# Patient Record
Sex: Female | Born: 1985 | Race: Black or African American | Hispanic: No | Marital: Married | Smoking: Never smoker
Health system: Southern US, Community
[De-identification: ages and names within clinical notes are randomized; demographics above are authoritative.]

## PROBLEM LIST (undated history)

## (undated) DIAGNOSIS — I10 Essential (primary) hypertension: Secondary | ICD-10-CM

## (undated) DIAGNOSIS — E119 Type 2 diabetes mellitus without complications: Secondary | ICD-10-CM

## (undated) DIAGNOSIS — Z9289 Personal history of other medical treatment: Secondary | ICD-10-CM

## (undated) DIAGNOSIS — N189 Chronic kidney disease, unspecified: Secondary | ICD-10-CM

## (undated) DIAGNOSIS — I509 Heart failure, unspecified: Secondary | ICD-10-CM

## (undated) DIAGNOSIS — D649 Anemia, unspecified: Secondary | ICD-10-CM

## (undated) DIAGNOSIS — R011 Cardiac murmur, unspecified: Secondary | ICD-10-CM

---
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
## 2006-03-13 ENCOUNTER — Emergency Department (HOSPITAL_COMMUNITY): Admission: EM | Admit: 2006-03-13 | Discharge: 2006-03-13 | Payer: Self-pay | Admitting: Emergency Medicine

## 2006-04-13 DIAGNOSIS — J309 Allergic rhinitis, unspecified: Secondary | ICD-10-CM | POA: Insufficient documentation

## 2006-04-13 DIAGNOSIS — E669 Obesity, unspecified: Secondary | ICD-10-CM | POA: Insufficient documentation

## 2007-05-04 LAB — URINE MICROSCOPIC
Bacteria: NEGATIVE /HPF
Casts: 0 /LPF
Crystals, urine: 0 /LPF
Mucus: 0 /LPF

## 2007-05-04 LAB — HCG URINE, QL. - POC: Pregnancy test,urine (POC): NEGATIVE

## 2007-05-04 MED ORDER — NAPROXEN 500 MG TAB
500 mg | ORAL_TABLET | Freq: Two times a day (BID) | ORAL | Status: DC
Start: 2007-05-04 — End: 2007-09-12

## 2007-05-04 MED ORDER — TRAMADOL 50 MG TAB
50 mg | ORAL | Status: AC
Start: 2007-05-04 — End: 2007-05-04
  Administered 2007-05-04: 12:00:00 via ORAL

## 2007-05-04 MED ORDER — CYCLOBENZAPRINE 10 MG TAB
10 mg | ORAL_TABLET | Freq: Two times a day (BID) | ORAL | Status: AC
Start: 2007-05-04 — End: 2007-05-14

## 2007-05-04 MED ORDER — IBUPROFEN 800 MG TAB
800 mg | ORAL | Status: DC
Start: 2007-05-04 — End: 2007-05-04

## 2007-05-04 MED FILL — TRAMADOL 50 MG TAB: 50 mg | ORAL | Qty: 1

## 2007-05-04 MED FILL — IBUPROFEN 800 MG TAB: 800 mg | ORAL | Qty: 1

## 2007-05-04 NOTE — ED Provider Notes (Signed)
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  Presents w/ 2-day h/o right side pain with movement and deep breaths.  No obvious trauma.  No fever, cough, n/v/d, GU complaints.  PCP = none.    Flank Pain   The history is provided by the patient. Associated symptoms include abdominal pain. Pertinent negatives include no chest pain, no fever, no headaches and no dysuria.        No past medical history on file.     No past surgical history on file.      No family history on file.     History   Social History   ??? Marital Status: N/A     Spouse Name: N/A     Number of Children: N/A   ??? Years of Education: N/A   Occupational History   ??? Not on file.   Social History Main Topics   ??? Tobacco Use: Never   ??? Alcohol Use: No   ??? Drug Use: No   ??? Sexually Active: No   Other Topics Concern   ??? Not on file   Social History Narrative   ??? No narrative on file           ALLERGIES: Review of patient's allergies indicates no known allergies.      Review of Systems   Constitutional: Negative for fever, chills and malaise/fatigue.   Respiratory: Negative for cough and shortness of breath.    Cardiovascular: Negative for chest pain and palpitations.   Gastrointestinal: Positive for abdominal pain. Negative for nausea, vomiting and diarrhea.   Genitourinary: Positive for flank pain. Negative for dysuria, urgency, frequency and hematuria.   Neurological: Negative for headaches.   All other systems reviewed and are negative.      Filed Vitals:    05/04/2007  7:33 AM   BP: 141/63   Pulse: 106   Temp: 97.4 ??F (36.3 ??C)   Resp: 18   Height: 5' (1.524 m)   SpO2: 97%              Physical Exam   Nursing note and vitals reviewed.  Constitutional: She appears well-developed.        No reproducible POP, only w/ leftward truncal mvmt.   HENT:   Head: Normocephalic and atraumatic.   Neck: Normal range of motion.   Cardiovascular: Normal rate, regular rhythm and normal heart sounds.     Pulmonary/Chest: Effort normal and breath sounds normal. She exhibits no tenderness.   Abdominal: Soft. No tenderness. She has no guarding and no CVA tenderness.   Musculoskeletal: She exhibits no edema.   Neurological: She is alert.   Skin: Skin is warm and dry.   Psychiatric: She has a normal mood and affect.        MDM Coding   Interpretation: labs

## 2007-09-12 MED ORDER — AZITHROMYCIN 1 GRAM ORAL PACKET
1 gram | ORAL | Status: AC
Start: 2007-09-12 — End: 2007-09-12
  Administered 2007-09-12: 22:00:00 via ORAL

## 2007-09-12 MED ORDER — LIDOCAINE HCL 1 % (10 MG/ML) IJ SOLN
10 mg/mL (1 %) | INTRAMUSCULAR | Status: AC
Start: 2007-09-12 — End: 2007-09-12
  Administered 2007-09-12: 22:00:00 via INTRAMUSCULAR

## 2007-09-12 MED FILL — CEFTRIAXONE 250 MG SOLUTION FOR INJECTION: 250 mg | INTRAMUSCULAR | Qty: 250

## 2007-09-12 MED FILL — AZITHROMYCIN 1 GRAM ORAL PACKET: 1 gram | ORAL | Qty: 1

## 2007-09-12 NOTE — ED Notes (Signed)
I have reviewed the chart and agree with the midlevel provider's course of treatment.  Jiayi Lengacher A Corbett III, MD

## 2007-09-12 NOTE — ED Notes (Signed)
States boyfriend dx with chlymadia

## 2007-09-12 NOTE — ED Provider Notes (Signed)
HPI Comments: The patient presents to the ED today for concerns of STD. Boyfriend told her 4 weeks ago that he was diagnosed with an STD. Patient did not get evaluated or treated. Reports occasional vaginal discharge but no pelvic pain/lower back pain. No fever or chills reported.    Vaginal Discharge   The history is provided by the patient.        No past medical history on file.     No past surgical history on file.      No family history on file.     History   Social History   ??? Marital Status: Single     Spouse Name: N/A     Number of Children: N/A   ??? Years of Education: N/A   Occupational History   ??? Not on file.   Social History Main Topics   ??? Tobacco Use: Never   ??? Alcohol Use: No   ??? Drug Use: No   ??? Sexually Active: Yes -- Female partner(s)     Birth Control/ Protection: None   Other Topics Concern   ??? Not on file   Social History Narrative   ??? No narrative on file           ALLERGIES: Review of patient's allergies indicates no known allergies.      Review of Systems   Genitourinary: Positive for vaginal discharge.   All other systems reviewed and are negative.      Filed Vitals:    09/12/2007  5:19 PM   BP: 165/91   Pulse: 87   Temp: 98.2 ??F (36.8 ??C)   Resp: 18   Height: 5\' 5"  (1.651 m)   Weight: 270 lb (122.471 kg)   SpO2: 99%              Physical Exam   Constitutional: She is oriented. She appears well-developed and well-nourished.   HENT:   Head: Normocephalic and atraumatic.   Right Ear: External ear normal.   Left Ear: External ear normal.   Nose: Nose normal.   Mouth/Throat: Oropharynx is clear and moist.   Pulmonary/Chest: Effort normal.   Neurological: She is alert and oriented.   Skin: Skin is warm and dry.   Psychiatric: She has a normal mood and affect. Her behavior is normal.            MDM Coding   Reviewed: nursing note and vitals      The patient has had positive exposure to an STD. Will treated accordingly for exposure. Currently using condoms with boyfriend.

## 2007-09-12 NOTE — ED Notes (Signed)
Pt discharged ambulatory to home. Pt teaching rt discharge and followup instructions. Pt voiced understanding.

## 2009-08-13 ENCOUNTER — Emergency Department (HOSPITAL_COMMUNITY): Admission: EM | Admit: 2009-08-13 | Discharge: 2009-08-13 | Payer: Self-pay | Admitting: Family Medicine

## 2010-05-02 LAB — POCT RAPID STREP A (OFFICE): Streptococcus, Group A Screen (Direct): NEGATIVE

## 2011-01-15 MED ORDER — TRAMADOL 50 MG TAB
50 mg | ORAL_TABLET | Freq: Four times a day (QID) | ORAL | Status: AC | PRN
Start: 2011-01-15 — End: ?

## 2011-01-15 MED ORDER — DIPHTH,PERTUS(AC)TETANUS VAC(PF) 2.5 LF UNIT-8 MCG-5 LF/0.5 ML INJ
INTRAMUSCULAR | Status: AC
Start: 2011-01-15 — End: 2011-01-15
  Administered 2011-01-15: 23:00:00 via INTRAMUSCULAR

## 2011-01-15 NOTE — ED Notes (Signed)
Dc with rx and dc instructions; reviewed with pt; sts understanding; left with so.

## 2011-01-15 NOTE — ED Provider Notes (Signed)
HPI Comments: 25 yo female c/o laceration between her right 1st and 2nd digit after cutting it on a knife while trying to get a piece of cheesecake. Patient denies numbness, tingling or limited ROM. Bleeding controlled. Patient unsure of tetanus status.       Pmhx:   Shx:     Patient is a 25 y.o. female presenting with skin laceration. The history is provided by the patient.   Laceration   The incident occurred 1 to 2 hours ago. The laceration is located on the left hand. The laceration is 3 cm in size. The injury mechanism is a clean knife. Foreign body present: no. The pain is moderate. The pain has been intermittent since onset. Pertinent negatives include no numbness, no tingling, no weakness, no loss of motion, no coolness and no discoloration. It is unknown when the patient last had a tetanus shot.        No past medical history on file.     No past surgical history on file.      No family history on file.     History     Social History   ??? Marital Status: Single     Spouse Name: N/A     Number of Children: N/A   ??? Years of Education: N/A     Occupational History   ??? Not on file.     Social History Main Topics   ??? Smoking status: Never Smoker    ??? Smokeless tobacco: Not on file   ??? Alcohol Use: No   ??? Drug Use: No   ??? Sexually Active: Yes -- Female partner(s)     Birth Control/ Protection: None     Other Topics Concern   ??? Not on file     Social History Narrative   ??? No narrative on file                  ALLERGIES: Review of patient's allergies indicates no known allergies.      Review of Systems   Constitutional: Negative for fever, chills, diaphoresis, activity change, appetite change, fatigue and unexpected weight change.   Respiratory: Negative for apnea, cough, choking, chest tightness, shortness of breath, wheezing and stridor.    Cardiovascular: Negative for chest pain, palpitations and leg swelling.   Gastrointestinal: Negative for nausea, vomiting, diarrhea and constipation.    Musculoskeletal: Negative for myalgias, back pain, joint swelling, arthralgias and gait problem.   Skin: Positive for wound (laceration to right hand). Negative for color change, pallor and rash.   Neurological: Negative for tingling, weakness and numbness.   Psychiatric/Behavioral: Negative for behavioral problems, confusion and agitation.       Filed Vitals:    01/15/11 1434 01/15/11 1818   BP: 165/92 148/89   Pulse: 98 92   Temp: 98.3 ??F (36.8 ??C)    Resp: 17 18   Height: 5\' 5"  (1.651 m)    Weight: 145.151 kg (320 lb)    SpO2: 98%             Physical Exam   Nursing note and vitals reviewed.  Constitutional: She is oriented to person, place, and time. Vital signs are normal. She appears well-developed and well-nourished.  Non-toxic appearance. She does not have a sickly appearance. She does not appear ill. No distress.   HENT:   Head: Normocephalic and atraumatic.   Right Ear: External ear normal.   Left Ear: External ear normal.   Eyes: Conjunctivae and EOM  are normal. Pupils are equal, round, and reactive to light. Right eye exhibits no discharge. Left eye exhibits no discharge. No scleral icterus.   Neck: Normal range of motion. Neck supple.   Cardiovascular: Normal rate, regular rhythm, S1 normal, S2 normal and normal heart sounds.  Exam reveals no gallop.    No murmur heard.  Pulmonary/Chest: Effort normal and breath sounds normal. No respiratory distress. She has no decreased breath sounds. She has no wheezes. She has no rhonchi. She has no rales.   Musculoskeletal:        Right hand: She exhibits swelling. She exhibits normal range of motion, no tenderness, no bony tenderness, normal two-point discrimination, normal capillary refill, no deformity and no laceration. normal sensation noted. Normal strength noted.        Hands:  Neurological: She is alert and oriented to person, place, and time.    Skin: Skin is warm and dry. Laceration noted. No abrasion, no bruising, no burn, no ecchymosis, no lesion and no rash noted. She is not diaphoretic. No erythema. No pallor.   Psychiatric: She has a normal mood and affect. Her speech is normal and behavior is normal. Judgment and thought content normal.        MDM     Risk of Significant Complications, Morbidity, and/or Mortality:   Presenting problems:  Low  Diagnostic procedures:  Low  Management options:  Low      Wound Repair  Date/Time: 01/15/2011 7:51 PM  Performed by: Esmeralda Links provider: Zachery Dauer  Pre-procedure re-eval: Immediately prior to the procedure, the patient was reevaluated and found suitable for the planned procedure and any planned medications.  Location details: right hand  Wound length:2.6 - 7.5 cm  Anesthesia: local infiltration and digital block  Local anesthetic: lidocaine 1% without epinephrine  Anesthetic total: 4 ml  Foreign bodies: no foreign bodies  Irrigation solution: saline  Irrigation method: syringe  Debridement: none  Skin closure: 4-0 nylon  Number of sutures: 7  Technique: simple and interrupted  Approximation: close  Patient tolerance: Patient tolerated the procedure well with no immediate complications.  My total time at bedside, performing this procedure was 16-30 minutes.  Comments: Neurovascular intact before and after procedure without evidence of tendon involvement.          I have discussed the results of labs, procedures, radiographs, treatments as well as any previous results found within the [facility] with the patient and available family.?? A treatment plan was developed in conjunction with the patient and was agreed upon. The patient is ready for discharge at this time.?? All voiced understanding of the discharge plan and medication instructions or changes as appropriate.?? Questions about treatment in the ED were answered.?? The patient was encouraged to return should symptoms worsen or new problems develop. A follow up physician was provided to the patient on the discharge papers.

## 2011-01-21 NOTE — ED Notes (Signed)
Awaiting PA exam.

## 2011-01-21 NOTE — ED Notes (Signed)
PA exam and dc.

## 2011-01-21 NOTE — ED Provider Notes (Signed)
HPI Comments: Patient here to have wound checked.  Stitched here Saturday for finger laceration on left thumb.  States she had drainage from the wound, states it throbs occasionally while working, and she believes it is red.    Patient is a 25 y.o. female presenting with wound check. The history is provided by the patient.   Wound Check   This is a new problem. Episode onset: 5-6 days. The problem occurs constantly. The problem has been gradually improving. Pain location: Left thumb. The pain is at a severity of 6/10. The pain is mild. Pertinent negatives include no numbness, full range of motion, no stiffness, no tingling, no itching, no back pain and no neck pain. The symptoms are aggravated by movement. She has tried nothing for the symptoms.        No past medical history on file.     No past surgical history on file.      No family history on file.     History     Social History   ??? Marital Status: Single     Spouse Name: N/A     Number of Children: N/A   ??? Years of Education: N/A     Occupational History   ??? Not on file.     Social History Main Topics   ??? Smoking status: Never Smoker    ??? Smokeless tobacco: Not on file   ??? Alcohol Use: No   ??? Drug Use: No   ??? Sexually Active: Yes -- Female partner(s)     Birth Control/ Protection: None     Other Topics Concern   ??? Not on file     Social History Narrative   ??? No narrative on file                  ALLERGIES: Review of patient's allergies indicates no known allergies.      Review of Systems   Constitutional: Negative for fever, chills, activity change and fatigue.   HENT: Negative for neck pain.    Genitourinary: Negative for dysuria, hematuria, flank pain, decreased urine volume and pelvic pain.   Musculoskeletal: Negative for back pain and stiffness.   Skin: Positive for wound. Negative for color change, itching, pallor and rash.   Neurological: Negative for tingling and numbness.   All other systems reviewed and are negative.        Filed Vitals:     01/21/11 0927   BP: 165/94   Pulse: 90   Temp: 97.7 ??F (36.5 ??C)   Resp: 17   Height: 5\' 5"  (1.651 m)   Weight: 145.151 kg (320 lb)            Physical Exam   Nursing note and vitals reviewed.  Constitutional: She is oriented to person, place, and time. She appears well-developed and well-nourished. No distress.   HENT:   Head: Normocephalic and atraumatic.   Eyes: Conjunctivae and EOM are normal. Pupils are equal, round, and reactive to light.   Neck: Normal range of motion. Neck supple.   Cardiovascular: Normal rate, regular rhythm and normal heart sounds.  Exam reveals no gallop and no friction rub.    No murmur heard.  Pulmonary/Chest: Effort normal and breath sounds normal. No respiratory distress.   Abdominal: Soft. Bowel sounds are normal. There is no tenderness.   Musculoskeletal: Normal range of motion.        Left hand: normal sensation noted. Normal strength noted.  Hands:  Neurological: She is alert and oriented to person, place, and time. No cranial nerve deficit.   Skin: Skin is warm and dry. She is not diaphoretic.        Psychiatric: She has a normal mood and affect. Her behavior is normal. Judgment and thought content normal.        MDM     Differential Diagnosis; Clinical Impression; Plan:     Patient here for wound check.  Appears to be healing appropriately, no signs of infection.  Instructed to follow up as previously instructed for suture removal  Risk of Significant Complications, Morbidity, and/or Mortality:   Presenting problems:  Low  Diagnostic procedures:  Low  Management options:  Low  Progress:   Patient progress:  Stable      Procedures     I have discussed the results of labs, procedures, radiographs, treatments as well as any previous results found within the [facility] with the patient and available family.?? A treatment plan was developed in conjunction with the patient and was agreed upon. The patient is ready for discharge at this time.?? All voiced understanding of the discharge plan and medication instructions or changes as appropriate.?? Questions about treatment in the ED were answered.?? The patient was encouraged to return should symptoms worsen or new problems develop. A follow up physician was provided to the patient on the discharge papers.

## 2011-01-21 NOTE — ED Notes (Signed)
To hall A for wound check.

## 2014-05-01 NOTE — Progress Notes (Signed)
LMOVM for pt to call back to reschedule appt for tomorrow morning - Dr Katrinka BlazingSmith had a death in the family and will be out of town. Will offer pt appt this afternoon if possible.

## 2014-05-02 ENCOUNTER — Encounter: Attending: Obstetrics & Gynecology | Primary: Student in an Organized Health Care Education/Training Program

## 2015-05-04 ENCOUNTER — Encounter (HOSPITAL_COMMUNITY): Payer: Self-pay | Admitting: Emergency Medicine

## 2015-05-04 ENCOUNTER — Emergency Department (HOSPITAL_COMMUNITY)
Admission: EM | Admit: 2015-05-04 | Discharge: 2015-05-04 | Disposition: A | Discharge status: Home / Self Care | Payer: BLUE CROSS/BLUE SHIELD | Source: Home / Self Care | Attending: Family Medicine | Admitting: Family Medicine

## 2015-05-04 DIAGNOSIS — R1031 Right lower quadrant pain: Secondary | ICD-10-CM

## 2015-05-04 NOTE — ED Notes (Signed)
Abdominal pain that started Saturday morning .  Sunday, pain increased.  Today pain continues, but not as bad as yesterday.  Patient denies vomiting.  Denies diarrhea, last bowel movement was today and normal per patient.  Pain in right side of abdomen.  Denies vaginal discharge.  Denies urinary symptoms

## 2015-05-04 NOTE — Discharge Instructions (Signed)
Diet and activity as tolerated. Return to ER if problem worsens

## 2015-05-04 NOTE — ED Provider Notes (Signed)
CSN: [id number]     Arrival date & time 05/04/15  1625 History   First MD Initiated Contact with Patient 05/04/15 1834     Chief Complaint  Patient presents with  . Abdominal Pain   (Consider location/radiation/quality/duration/timing/severity/associated sxs/prior Treatment) Patient is a 30 y.o. female presenting with abdominal pain. The history is provided by the patient.  Abdominal Pain Pain location:  RLQ Pain quality: dull   Pain radiates to:  Does not radiate Pain severity:  Mild Onset quality:  Gradual Duration:  3 days Progression:  Improving Chronicity:  New Context comment:  Under considerable stress with father in hosp with limited life expect. Relieved by:  None tried Worsened by:  Nothing tried Associated symptoms: no constipation, no diarrhea, no dysuria, no fever, no nausea, no vaginal discharge and no vomiting   Risk factors: obesity     History reviewed. No pertinent past medical history. History reviewed. No pertinent past surgical history. No family history on file. Social History  Substance Use Topics  . Smoking status: Never Smoker   . Smokeless tobacco: None  . Alcohol Use: Yes   OB History    No data available     Review of Systems  Constitutional: Negative.  Negative for fever.  Cardiovascular: Negative.   Gastrointestinal: Positive for abdominal pain. Negative for nausea, vomiting, diarrhea, constipation, blood in stool, abdominal distention and rectal pain.  Genitourinary: Negative.  Negative for dysuria and vaginal discharge.  All other systems reviewed and are negative.   Allergies  Review of patient's allergies indicates no known allergies.  Home Medications   Prior to Admission medications   Not on File   Meds Ordered and Administered this Visit  Medications - No data to display  BP 193/114 mmHg  Pulse 109  Temp(Src) 99.1 F (37.3 C) (Oral)  Resp 18  SpO2 98% No data found.   Physical Exam  Constitutional: She is oriented  to person, place, and time. She appears well-developed and well-nourished. No distress.  Cardiovascular: Normal rate, regular rhythm, normal heart sounds and intact distal pulses.   Pulmonary/Chest: Effort normal and breath sounds normal.  Abdominal: Soft. Bowel sounds are normal. She exhibits no distension and no mass. There is tenderness. There is no rebound and no guarding.  Neurological: She is alert and oriented to person, place, and time.  Skin: Skin is warm and dry.  Nursing note and vitals reviewed.   ED Course  Procedures (including critical care time)  Labs Review Labs Reviewed - No data to display  Imaging Review No results found.   Visual Acuity Review  Right Eye Distance:   Left Eye Distance:   Bilateral Distance:    Right Eye Near:   Left Eye Near:    Bilateral Near:         MDM   1. Abdominal pain of unknown cause, right        Billy Fischer, MD 05/04/15 [phone]

## 2015-05-07 ENCOUNTER — Emergency Department (HOSPITAL_COMMUNITY)
Admission: EM | Admit: 2015-05-07 | Discharge: 2015-05-07 | Disposition: A | Discharge status: Home / Self Care | Payer: BLUE CROSS/BLUE SHIELD | Attending: Emergency Medicine | Admitting: Emergency Medicine

## 2015-05-07 ENCOUNTER — Encounter (HOSPITAL_COMMUNITY): Payer: Self-pay | Admitting: Cardiology

## 2015-05-07 DIAGNOSIS — R109 Unspecified abdominal pain: Secondary | ICD-10-CM

## 2015-05-07 DIAGNOSIS — A599 Trichomoniasis, unspecified: Secondary | ICD-10-CM

## 2015-05-07 DIAGNOSIS — R1030 Lower abdominal pain, unspecified: Secondary | ICD-10-CM | POA: Diagnosis present

## 2015-05-07 DIAGNOSIS — A5901 Trichomonal vulvovaginitis: Secondary | ICD-10-CM | POA: Insufficient documentation

## 2015-05-07 DIAGNOSIS — Z3202 Encounter for pregnancy test, result negative: Secondary | ICD-10-CM | POA: Diagnosis not present

## 2015-05-07 LAB — CBC
HEMATOCRIT: 39.2 % (ref 36.0–46.0)
HEMOGLOBIN: 12.3 g/dL (ref 12.0–15.0)
MCH: 24 pg — AB (ref 26.0–34.0)
MCHC: 31.4 g/dL (ref 30.0–36.0)
MCV: 76.4 fL — AB (ref 78.0–100.0)
PLATELETS: 337 10*3/uL (ref 150–400)
RBC: 5.13 MIL/uL — AB (ref 3.87–5.11)
RDW: 14.2 % (ref 11.5–15.5)
WBC: 8.1 10*3/uL (ref 4.0–10.5)

## 2015-05-07 LAB — COMPREHENSIVE METABOLIC PANEL
ALT: 18 U/L (ref 14–54)
AST: 20 U/L (ref 15–41)
Albumin: 3.9 g/dL (ref 3.5–5.0)
Alkaline Phosphatase: 59 U/L (ref 38–126)
Anion gap: 11 (ref 5–15)
BUN: 6 mg/dL (ref 6–20)
CHLORIDE: 101 mmol/L (ref 101–111)
CO2: 24 mmol/L (ref 22–32)
CREATININE: 0.74 mg/dL (ref 0.44–1.00)
Calcium: 9.5 mg/dL (ref 8.9–10.3)
GFR calc non Af Amer: >60 mL/min (ref >60)
Glucose, Bld: 383 mg/dL — ABNORMAL HIGH (ref 65–99)
Potassium: 4 mmol/L (ref 3.5–5.1)
SODIUM: 136 mmol/L (ref 135–145)
Total Bilirubin: 0.6 mg/dL (ref 0.3–1.2)
Total Protein: 8 g/dL (ref 6.5–8.1)

## 2015-05-07 LAB — LIPASE, BLOOD: LIPASE: 32 U/L (ref 11–51)

## 2015-05-07 LAB — URINALYSIS, ROUTINE W REFLEX MICROSCOPIC
Bilirubin Urine: NEGATIVE
HGB URINE DIPSTICK: NEGATIVE
Ketones, ur: NEGATIVE mg/dL
Leukocytes, UA: NEGATIVE
Nitrite: NEGATIVE
PH: 5 (ref 5.0–8.0)
Protein, ur: 100 mg/dL — AB
SPECIFIC GRAVITY, URINE: 1.038 — AB (ref 1.005–1.030)

## 2015-05-07 LAB — URINE MICROSCOPIC-ADD ON: RBC / HPF: NONE SEEN RBC/hpf (ref 0–5)

## 2015-05-07 LAB — I-STAT BETA HCG BLOOD, ED (MC, WL, AP ONLY): I-stat hCG, quantitative: <5 m[IU]/mL (ref <5)

## 2015-05-07 MED ORDER — METRONIDAZOLE 500 MG PO TABS
2000.0000 mg | ORAL_TABLET | Freq: Once | ORAL | Status: AC
  Administered 2015-05-07: 2000 mg via ORAL
  Filled 2015-05-07: qty 4

## 2015-05-07 MED ORDER — LIDOCAINE HCL (PF) 1 % IJ SOLN
INTRAMUSCULAR | Status: AC
  Administered 2015-05-07: 1.5 mL
  Filled 2015-05-07: qty 5

## 2015-05-07 MED ORDER — CEFTRIAXONE SODIUM 250 MG IJ SOLR
250.0000 mg | Freq: Once | INTRAMUSCULAR | Status: AC
  Administered 2015-05-07: 250 mg via INTRAMUSCULAR
  Filled 2015-05-07: qty 250

## 2015-05-07 MED ORDER — AZITHROMYCIN 250 MG PO TABS
1000.0000 mg | ORAL_TABLET | Freq: Once | ORAL | Status: AC
  Administered 2015-05-07: 1000 mg via ORAL
  Filled 2015-05-07: qty 4

## 2015-05-07 NOTE — ED Notes (Signed)
Social worker into speak to pt.

## 2015-05-07 NOTE — ED Notes (Signed)
Pt reports lower abd pain that started a couple of days ago. Denies any n/v. States she does not have a hx of HTN.

## 2015-05-07 NOTE — ED Notes (Addendum)
Rt groin pain since Friday,  States was seen at [facility] on Monday and told to come to er if no better, states last bm was normal this am, she has very irreg periods, no BC , states trying to get preg  ,states last intercourse was Friday, hurts to bend forward, denies n/v/d/vag d/c or dysuria.

## 2015-05-07 NOTE — Discharge Instructions (Signed)
Abdominal Pain, Adult Many things can cause abdominal pain. Usually, abdominal pain is not caused by a disease and will improve without treatment. It can often be observed and treated at home. Your health care provider will do a physical exam and possibly order blood tests and X-rays to help determine the seriousness of your pain. However, in many cases, more time must pass before a clear cause of the pain can be found. Before that point, your health care provider may not know if you need more testing or further treatment. HOME CARE INSTRUCTIONS Monitor your abdominal pain for any changes. The following actions may help to alleviate any discomfort you are experiencing:  Only take over-the-counter or prescription medicines as directed by your health care provider.  Do not take laxatives unless directed to do so by your health care provider.  Try a clear liquid diet (broth, tea, or water) as directed by your health care provider. Slowly move to a bland diet as tolerated. SEEK MEDICAL CARE IF:  You have unexplained abdominal pain.  You have abdominal pain associated with nausea or diarrhea.  You have pain when you urinate or have a bowel movement.  You experience abdominal pain that wakes you in the night.  You have abdominal pain that is worsened or improved by eating food.  You have abdominal pain that is worsened with eating fatty foods.  You have a fever. SEEK IMMEDIATE MEDICAL CARE IF:  Your pain does not go away within 2 hours.  You keep throwing up (vomiting).  Your pain is felt only in portions of the abdomen, such as the right side or the left lower portion of the abdomen.  You pass bloody or black tarry stools. MAKE SURE YOU:  Understand these instructions.  Will watch your condition.  Will get help right away if you are not doing well or get worse.   This information is not intended to replace advice given to you by your health care provider. Make sure you discuss  any questions you have with your health care provider.   Document Released: 11/10/2004 Document Revised: 10/22/2014 Document Reviewed: 10/10/2012 Elsevier Interactive Patient Education 2016 [employer] Trichomonas Test The trichomonas test is done to diagnose trichomoniasis, an infection caused by an organism called Trichomonas. Trichomoniasis is a sexually transmitted infection (STI). In women, it causes vaginal infections. In men, it can cause the tube that carries urine (urethra) to become inflamed (urethritis). You may have this test as a part of a routine screening for STIs or if you have symptoms of trichomoniasis. To perform the test, your health care provider will take a sample of discharge. The sample is taken from the vagina or cervix in women and from the urethra in men. A urine sample can also be used for testing. RESULTS It is your responsibility to obtain your test results. Ask the lab or department performing the test when and how you will get your results. Contact your health care provider to discuss any questions you have about your results.  Meaning of Negative Test Results A negative test means you do not have trichomoniasis. Follow your health care provider's directions about any follow-up testing.  Meaning of Positive Test Results A positive test result means you have an active infection that needs to be treated with antibiotic medicine. All your current sexual partners must also be treated or it is likely you will get reinfected.  If your test is positive, your health care provider will start you on medicine and  may advise you to:  Not have sexual intercourse until your infection has cleared up.  Use a latex condom properly every time you have sexual intercourse.  Limit the number of sexual partners you have. The more partners you have, the greater your risk of contracting trichomoniasis or another STI.  Tell all sexual partners about your infection so they can also be  treated and to prevent reinfection.   This information is not intended to replace advice given to you by your health care provider. Make sure you discuss any questions you have with your health care provider.   Document Released: 03/05/2004 Document Revised: 02/21/2014 Document Reviewed: 02/12/2013 Elsevier Interactive Patient Education [employer].

## 2015-05-07 NOTE — ED Provider Notes (Signed)
CSN: [id number]     Arrival date & time 05/07/15  1125 History   First MD Initiated Contact with Patient 05/07/15 1150     Chief Complaint  Patient presents with  . Abdominal Pain     (Consider location/radiation/quality/duration/timing/severity/associated sxs/prior Treatment) HPI Comments: Patient presents to the emergency department with chief complaint of abdominal pain. She states that she has had vague intermittent lower abdominal pain for the past several days. She denies any vaginal discharge or bleeding. She denies any fevers chills. Denies any nausea or vomiting. There are no modifying factors. It is not worsened with palpation or movement. She has not tried taking anything for her symptoms.  The history is provided by the patient. No language interpreter was used.    History reviewed. No pertinent past medical history. History reviewed. No pertinent past surgical history. History reviewed. No pertinent family history. Social History  Substance Use Topics  . Smoking status: Never Smoker   . Smokeless tobacco: None  . Alcohol Use: Yes   OB History    No data available     Review of Systems  Constitutional: Negative for fever and chills.  Respiratory: Negative for shortness of breath.   Cardiovascular: Negative for chest pain.  Gastrointestinal: Positive for abdominal pain. Negative for nausea, vomiting, diarrhea and constipation.  Genitourinary: Negative for dysuria.  All other systems reviewed and are negative.     Allergies  Review of patient's allergies indicates no known allergies.  Home Medications   Prior to Admission medications   Not on File   BP 176/108 mmHg  Pulse 96  Temp(Src) 98.3 F (36.8 C) (Oral)  Resp 16  SpO2 100% Physical Exam  Constitutional: She is oriented to person, place, and time. She appears well-developed and well-nourished.  HENT:  Head: Normocephalic and atraumatic.  Eyes: Conjunctivae and EOM are normal. Pupils are equal,  round, and reactive to light.  Neck: Normal range of motion. Neck supple.  Cardiovascular: Normal rate and regular rhythm.  Exam reveals no gallop and no friction rub.   No murmur heard. Pulmonary/Chest: Effort normal and breath sounds normal. No respiratory distress. She has no wheezes. She has no rales. She exhibits no tenderness.  Abdominal: Soft. Bowel sounds are normal. She exhibits no distension and no mass. There is no tenderness. There is no rebound and no guarding.  No focal abdominal tenderness, no RLQ tenderness or pain at McBurney's point, no RUQ tenderness or Murphy's sign, no left-sided abdominal tenderness, no fluid wave, or signs of peritonitis   Musculoskeletal: Normal range of motion. She exhibits no edema or tenderness.  Neurological: She is alert and oriented to person, place, and time.  Skin: Skin is warm and dry.  Psychiatric: She has a normal mood and affect. Her behavior is normal. Judgment and thought content normal.  Nursing note and vitals reviewed.   ED Course  Procedures (including critical care time) Labs Review Labs Reviewed  COMPREHENSIVE METABOLIC PANEL - Abnormal; Notable for the following:    Glucose, Bld 383 (*)    All other components within normal limits  CBC - Abnormal; Notable for the following:    RBC 5.13 (*)    MCV 76.4 (*)    MCH 24.0 (*)    All other components within normal limits  URINALYSIS, ROUTINE W REFLEX MICROSCOPIC (NOT AT [facility]) - Abnormal; Notable for the following:    APPearance HAZY (*)    Specific Gravity, Urine 1.038 (*)    Glucose, UA >1000 (*)  Protein, ur 100 (*)    All other components within normal limits  URINE MICROSCOPIC-ADD ON - Abnormal; Notable for the following:    Squamous Epithelial / LPF 6-30 (*)    Bacteria, UA FEW (*)    All other components within normal limits  LIPASE, BLOOD  I-STAT BETA HCG BLOOD, ED ([facility], [facility], [facility] ONLY)    I have personally reviewed and evaluated these images and lab results as part  of my medical decision-making.   MDM   Final diagnoses:  Abdominal pain, unspecified abdominal location  Trichomonas infection    Patient with improving lower abdominal pain. She states that it was worst on Sunday, but has significantly improved. There is no tenderness to palpation. She is very well-appearing. Vital signs are stable. No leukocytosis. No electrolyte abnormalities. She does have a glucose of 383, I'm concerned about diabetes, and have set her up with primary care follow-up appointment on Tuesday of next week.  Urinalysis does show evidence of trichomonas. Will treat with Flagyl. Because of trichomonas, will also cover for gonorrhea and chlamydia, although the patient denies any vaginal discharge, have encouraged her to follow-up with an OB/GYN. She understands and agrees to plan. She is stable for discharge.    Montine Circle, PA-C 05/07/15 1516  Sherwood Gambler, MD 05/10/15 [phone]

## 2015-05-07 NOTE — Care Management Note (Signed)
Case Management Note  Patient Details  Name: [facility] MRN: [id number] Date of Birth: September 14, 1985  Subjective/Objective:                  Pt reports lower abd pain that started a couple of days ago.//Home with spouse.  Action/Plan: Follow for disposition needs.//Set up follow-up appointment.   Expected Discharge Date:       05/07/15           Expected Discharge Plan:  Home/Self Care  In-House Referral:     Discharge planning Services  CM Consult, Follow-up appt scheduled  Post Acute Care Choice:  NA Choice offered to:  Patient  DME Arranged:  N/A DME Agency:  NA  HH Arranged:  NA HH Agency:  NA  Status of Service:  Completed, signed off  Medicare Important Message Given:    Date Medicare IM Given:    Medicare IM give by:    Date Additional Medicare IM Given:    Additional Medicare Important Message give by:     If discussed at [place] of Stay Meetings, dates discussed:    Additional Comments: Raidyn Wassink J. Clydene Laming, [place], [place], [place] [phone] ERCM set up appointment with Selina Cooley, NP on 3/28 @0900 .  Spoke with pt at bedside and provided brochure with directions and phone number highlighted.  Pt verbalizes understanding of keeping appointment.  Fuller Mandril, RN 05/07/2015, 2:31 PM

## 2017-10-18 DIAGNOSIS — E119 Type 2 diabetes mellitus without complications: Secondary | ICD-10-CM | POA: Insufficient documentation

## 2017-10-19 DIAGNOSIS — I5189 Other ill-defined heart diseases: Secondary | ICD-10-CM | POA: Insufficient documentation

## 2018-07-14 DIAGNOSIS — R197 Diarrhea, unspecified: Secondary | ICD-10-CM | POA: Diagnosis not present

## 2018-07-14 DIAGNOSIS — E876 Hypokalemia: Secondary | ICD-10-CM | POA: Diagnosis not present

## 2018-07-14 DIAGNOSIS — R1084 Generalized abdominal pain: Secondary | ICD-10-CM | POA: Diagnosis not present

## 2019-08-13 ENCOUNTER — Other Ambulatory Visit: Payer: Self-pay

## 2019-08-13 ENCOUNTER — Emergency Department (INDEPENDENT_AMBULATORY_CARE_PROVIDER_SITE_OTHER)
Admission: EM | Admit: 2019-08-13 | Discharge: 2019-08-13 | Disposition: A | Discharge status: Home / Self Care | Payer: BC Managed Care – PPO | Source: Home / Self Care | Attending: Family Medicine | Admitting: Family Medicine

## 2019-08-13 DIAGNOSIS — N1831 Chronic kidney disease, stage 3a: Secondary | ICD-10-CM | POA: Diagnosis not present

## 2019-08-13 DIAGNOSIS — I161 Hypertensive emergency: Secondary | ICD-10-CM | POA: Diagnosis not present

## 2019-08-13 DIAGNOSIS — Z9114 Patient's other noncompliance with medication regimen: Secondary | ICD-10-CM | POA: Diagnosis not present

## 2019-08-13 DIAGNOSIS — I517 Cardiomegaly: Secondary | ICD-10-CM | POA: Diagnosis not present

## 2019-08-13 DIAGNOSIS — I5043 Acute on chronic combined systolic (congestive) and diastolic (congestive) heart failure: Secondary | ICD-10-CM | POA: Diagnosis not present

## 2019-08-13 DIAGNOSIS — I509 Heart failure, unspecified: Secondary | ICD-10-CM | POA: Diagnosis not present

## 2019-08-13 DIAGNOSIS — R944 Abnormal results of kidney function studies: Secondary | ICD-10-CM | POA: Diagnosis not present

## 2019-08-13 DIAGNOSIS — I5031 Acute diastolic (congestive) heart failure: Secondary | ICD-10-CM | POA: Diagnosis not present

## 2019-08-13 DIAGNOSIS — I5189 Other ill-defined heart diseases: Secondary | ICD-10-CM | POA: Diagnosis not present

## 2019-08-13 DIAGNOSIS — I313 Pericardial effusion (noninflammatory): Secondary | ICD-10-CM | POA: Diagnosis not present

## 2019-08-13 DIAGNOSIS — N19 Unspecified kidney failure: Secondary | ICD-10-CM | POA: Diagnosis not present

## 2019-08-13 DIAGNOSIS — R0989 Other specified symptoms and signs involving the circulatory and respiratory systems: Secondary | ICD-10-CM | POA: Diagnosis not present

## 2019-08-13 DIAGNOSIS — J90 Pleural effusion, not elsewhere classified: Secondary | ICD-10-CM | POA: Diagnosis not present

## 2019-08-13 DIAGNOSIS — J9811 Atelectasis: Secondary | ICD-10-CM | POA: Diagnosis not present

## 2019-08-13 DIAGNOSIS — E1365 Other specified diabetes mellitus with hyperglycemia: Secondary | ICD-10-CM | POA: Diagnosis not present

## 2019-08-13 DIAGNOSIS — R600 Localized edema: Secondary | ICD-10-CM | POA: Diagnosis not present

## 2019-08-13 DIAGNOSIS — R2243 Localized swelling, mass and lump, lower limb, bilateral: Secondary | ICD-10-CM | POA: Diagnosis not present

## 2019-08-13 DIAGNOSIS — M7989 Other specified soft tissue disorders: Secondary | ICD-10-CM | POA: Diagnosis not present

## 2019-08-13 DIAGNOSIS — R918 Other nonspecific abnormal finding of lung field: Secondary | ICD-10-CM | POA: Diagnosis not present

## 2019-08-13 DIAGNOSIS — D509 Iron deficiency anemia, unspecified: Secondary | ICD-10-CM | POA: Diagnosis not present

## 2019-08-13 DIAGNOSIS — I110 Hypertensive heart disease with heart failure: Secondary | ICD-10-CM | POA: Diagnosis not present

## 2019-08-13 DIAGNOSIS — I169 Hypertensive crisis, unspecified: Secondary | ICD-10-CM | POA: Diagnosis not present

## 2019-08-13 DIAGNOSIS — I081 Rheumatic disorders of both mitral and tricuspid valves: Secondary | ICD-10-CM | POA: Diagnosis not present

## 2019-08-13 DIAGNOSIS — Z6841 Body Mass Index (BMI) 40.0 and over, adult: Secondary | ICD-10-CM | POA: Diagnosis not present

## 2019-08-13 DIAGNOSIS — Z8249 Family history of ischemic heart disease and other diseases of the circulatory system: Secondary | ICD-10-CM | POA: Diagnosis not present

## 2019-08-13 DIAGNOSIS — R7989 Other specified abnormal findings of blood chemistry: Secondary | ICD-10-CM | POA: Diagnosis not present

## 2019-08-13 DIAGNOSIS — I10 Essential (primary) hypertension: Secondary | ICD-10-CM | POA: Diagnosis not present

## 2019-08-13 DIAGNOSIS — E1165 Type 2 diabetes mellitus with hyperglycemia: Secondary | ICD-10-CM | POA: Diagnosis not present

## 2019-08-13 DIAGNOSIS — E1122 Type 2 diabetes mellitus with diabetic chronic kidney disease: Secondary | ICD-10-CM | POA: Diagnosis not present

## 2019-08-13 NOTE — ED Notes (Signed)
Patient is being discharged from the Urgent Care and sent to the Emergency Department via EMS . Per DR. Beese, patient is in need of higher level of care due to hypertensive crisis and hx of same w/ hospital admission. Patient is aware and verbalizes understanding of plan of care.  Vitals:   08/13/19 1320 08/13/19 1329  BP:  (!) 250/146  Pulse: (!) 118   Resp: 18   SpO2: 96%

## 2019-08-13 NOTE — ED Triage Notes (Signed)
Pt c/o swelling in her feet and ankles intermittently for 2 weeks but feels she had had swelling all over constantly for 1 week. BP currently over 250+/146

## 2019-08-14 DIAGNOSIS — I5043 Acute on chronic combined systolic (congestive) and diastolic (congestive) heart failure: Secondary | ICD-10-CM | POA: Insufficient documentation

## 2019-08-15 DIAGNOSIS — D509 Iron deficiency anemia, unspecified: Secondary | ICD-10-CM | POA: Insufficient documentation

## 2019-08-17 NOTE — ED Provider Notes (Signed)
[facility]    CSN: [id number] Arrival date & time: 08/13/19  1305      History   Chief Complaint Chief Complaint  Patient presents with  . Leg Swelling    and feet    HPI [facility] is a 34 y.o. female.   Patient complains of intermittent swelling in her feet and ankles that started two weeks ago and has now been constant and uncomfortable for about a week.  She denies chest pain, shortness of breath, headache, neurologic symptoms. She states that she has been treated in the past for hypertension but stopped taking the medication.     History reviewed. No pertinent past medical history.  Patient Active Problem List   Diagnosis Date Noted  . OBESITY, NOS 04/13/2006  . RHINITIS, ALLERGIC 04/13/2006    History reviewed. No pertinent surgical history.  OB History   No obstetric history on file.      Home Medications    Prior to Admission medications   Not on File    Family History History reviewed. No pertinent family history.  Social History Social History   Tobacco Use  . Smoking status: Never Smoker  Vaping Use  . Vaping Use: Never used  Substance Use Topics  . Alcohol use: Yes  . Drug use: No     Allergies   Patient has no known allergies.   Review of Systems Review of Systems  Constitutional: Positive for fatigue. Negative for activity change, appetite change, chills, diaphoresis and fever.  HENT: Negative.   Eyes: Negative for photophobia and visual disturbance.  Respiratory: Negative for cough, chest tightness and shortness of breath.   Cardiovascular: Positive for leg swelling. Negative for chest pain and palpitations.  Gastrointestinal: Negative.   Genitourinary: Negative.   Musculoskeletal: Negative.   Skin: Negative.   Neurological: Negative for dizziness, tremors, seizures, syncope, speech difficulty, weakness, light-headedness, numbness and headaches.     Physical Exam Triage Vital Signs ED  Triage Vitals  Enc Vitals Group     BP 08/13/19 1329 (!) 250/146     Pulse Rate 08/13/19 1320 (!) 118     Resp 08/13/19 1320 18     Temp --      Temp src --      SpO2 08/13/19 1320 96 %     Weight --      Height --      Head Circumference --      Peak Flow --      Pain Score 08/13/19 1323 0     Pain Loc --      Pain Edu? --      Excl. in [place]? --    No data found.  Updated Vital Signs BP (!) 250/146 (BP Location: Right Arm) Comment: Did not inflate past 250. Provider made aware.  Pulse (!) 118   Resp 18   LMP  (LMP Unknown)   SpO2 96%   Visual Acuity Right Eye Distance:   Left Eye Distance:   Bilateral Distance:    Right Eye Near:   Left Eye Near:    Bilateral Near:     Physical Exam Nursing notes and Vital Signs reviewed. Appearance:  Patient appears stated age, and in no acute distress.    Eyes:  Pupils are equal, round, and reactive to light and accomodation.  Extraocular movement is intact.  Conjunctivae are not inflamed   Pharynx:  Normal; moist mucous membranes  Neck:  Supple.  No adenopathy Lungs:  Clear to auscultation.  Breath sounds are equal.  Moving air well. Heart:  Regular rate and rhythm without murmurs, rubs, or gallops.  Abdomen:  Nontender. Extremities:   Lower extremity edema present. Skin:  No rash present.     UC Treatments / Results  Labs (all labs ordered are listed, but only abnormal results are displayed) Labs Reviewed - No data to display  EKG  Rate:  115 BPM PR:  156 msec QT:  366 msec QTcH:  506 msec QRSD:  84 msec QRS axis:  56 degrees Interpretation:   Sinus tachycardia; possible left atrial enlargement  Radiology No results found.  Procedures Procedures (including critical care time)  Medications Ordered in UC Medications - No data to display  Initial Impression / Assessment and Plan / UC Course  I have reviewed the triage vital signs and the nursing notes.  Pertinent labs & imaging results that were available  during my care of the patient were reviewed by me and considered in my medical decision making (see chart for details).    Patient's BP 250+/146.  Exam otherwise benign except for peripheral edema. EKG appears benign.  Will transport patient to local hospital ED by EMS for evaluation/treatment.   Final Clinical Impressions(s) / UC Diagnoses   Final diagnoses:  Hypertensive crisis, unspecified   Discharge Instructions   None    ED Prescriptions    None        Kandra Nicolas, MD 08/17/19 [phone]

## 2019-08-26 DIAGNOSIS — I509 Heart failure, unspecified: Secondary | ICD-10-CM | POA: Diagnosis not present

## 2019-08-26 DIAGNOSIS — E119 Type 2 diabetes mellitus without complications: Secondary | ICD-10-CM | POA: Diagnosis not present

## 2019-08-26 DIAGNOSIS — R600 Localized edema: Secondary | ICD-10-CM | POA: Diagnosis not present

## 2019-08-26 DIAGNOSIS — Z79899 Other long term (current) drug therapy: Secondary | ICD-10-CM | POA: Diagnosis not present

## 2019-08-26 DIAGNOSIS — J811 Chronic pulmonary edema: Secondary | ICD-10-CM | POA: Diagnosis not present

## 2019-08-26 DIAGNOSIS — I110 Hypertensive heart disease with heart failure: Secondary | ICD-10-CM | POA: Diagnosis not present

## 2019-08-26 DIAGNOSIS — I517 Cardiomegaly: Secondary | ICD-10-CM | POA: Diagnosis not present

## 2019-08-26 DIAGNOSIS — Z794 Long term (current) use of insulin: Secondary | ICD-10-CM | POA: Diagnosis not present

## 2019-08-26 DIAGNOSIS — R0602 Shortness of breath: Secondary | ICD-10-CM | POA: Diagnosis not present

## 2019-08-26 DIAGNOSIS — J90 Pleural effusion, not elsewhere classified: Secondary | ICD-10-CM | POA: Diagnosis not present

## 2019-08-26 DIAGNOSIS — I10 Essential (primary) hypertension: Secondary | ICD-10-CM | POA: Diagnosis not present

## 2019-11-20 DIAGNOSIS — I10 Essential (primary) hypertension: Secondary | ICD-10-CM | POA: Diagnosis not present

## 2019-11-20 DIAGNOSIS — R9431 Abnormal electrocardiogram [ECG] [EKG]: Secondary | ICD-10-CM | POA: Diagnosis not present

## 2019-11-20 DIAGNOSIS — R0989 Other specified symptoms and signs involving the circulatory and respiratory systems: Secondary | ICD-10-CM | POA: Diagnosis not present

## 2019-11-20 DIAGNOSIS — I517 Cardiomegaly: Secondary | ICD-10-CM | POA: Diagnosis not present

## 2019-11-20 DIAGNOSIS — Z79899 Other long term (current) drug therapy: Secondary | ICD-10-CM | POA: Diagnosis not present

## 2019-11-20 DIAGNOSIS — I110 Hypertensive heart disease with heart failure: Secondary | ICD-10-CM | POA: Diagnosis not present

## 2019-11-20 DIAGNOSIS — Z9114 Patient's other noncompliance with medication regimen: Secondary | ICD-10-CM | POA: Diagnosis not present

## 2019-11-20 DIAGNOSIS — E119 Type 2 diabetes mellitus without complications: Secondary | ICD-10-CM | POA: Diagnosis not present

## 2019-11-20 DIAGNOSIS — Z794 Long term (current) use of insulin: Secondary | ICD-10-CM | POA: Diagnosis not present

## 2019-11-20 DIAGNOSIS — R7989 Other specified abnormal findings of blood chemistry: Secondary | ICD-10-CM | POA: Diagnosis not present

## 2019-11-20 DIAGNOSIS — R000 Tachycardia, unspecified: Secondary | ICD-10-CM | POA: Diagnosis not present

## 2019-11-20 DIAGNOSIS — I509 Heart failure, unspecified: Secondary | ICD-10-CM | POA: Diagnosis not present

## 2019-12-17 DIAGNOSIS — E559 Vitamin D deficiency, unspecified: Secondary | ICD-10-CM | POA: Diagnosis not present

## 2019-12-17 DIAGNOSIS — E1165 Type 2 diabetes mellitus with hyperglycemia: Secondary | ICD-10-CM | POA: Diagnosis not present

## 2019-12-17 DIAGNOSIS — E669 Obesity, unspecified: Secondary | ICD-10-CM | POA: Diagnosis not present

## 2019-12-17 DIAGNOSIS — Z0000 Encounter for general adult medical examination without abnormal findings: Secondary | ICD-10-CM | POA: Diagnosis not present

## 2019-12-17 DIAGNOSIS — I10 Essential (primary) hypertension: Secondary | ICD-10-CM | POA: Diagnosis not present

## 2019-12-19 DIAGNOSIS — E119 Type 2 diabetes mellitus without complications: Secondary | ICD-10-CM | POA: Diagnosis not present

## 2019-12-20 DIAGNOSIS — E119 Type 2 diabetes mellitus without complications: Secondary | ICD-10-CM | POA: Diagnosis not present

## 2019-12-24 DIAGNOSIS — E1122 Type 2 diabetes mellitus with diabetic chronic kidney disease: Secondary | ICD-10-CM | POA: Diagnosis not present

## 2019-12-24 DIAGNOSIS — I10 Essential (primary) hypertension: Secondary | ICD-10-CM | POA: Diagnosis not present

## 2019-12-24 DIAGNOSIS — N184 Chronic kidney disease, stage 4 (severe): Secondary | ICD-10-CM | POA: Diagnosis not present

## 2020-01-07 DIAGNOSIS — E1122 Type 2 diabetes mellitus with diabetic chronic kidney disease: Secondary | ICD-10-CM | POA: Diagnosis not present

## 2020-01-07 DIAGNOSIS — N184 Chronic kidney disease, stage 4 (severe): Secondary | ICD-10-CM | POA: Diagnosis not present

## 2020-01-07 DIAGNOSIS — I10 Essential (primary) hypertension: Secondary | ICD-10-CM | POA: Diagnosis not present

## 2020-01-07 DIAGNOSIS — E7849 Other hyperlipidemia: Secondary | ICD-10-CM | POA: Diagnosis not present

## 2020-01-17 DIAGNOSIS — N184 Chronic kidney disease, stage 4 (severe): Secondary | ICD-10-CM | POA: Diagnosis not present

## 2020-01-17 DIAGNOSIS — R809 Proteinuria, unspecified: Secondary | ICD-10-CM | POA: Diagnosis not present

## 2020-01-17 DIAGNOSIS — D631 Anemia in chronic kidney disease: Secondary | ICD-10-CM | POA: Diagnosis not present

## 2020-01-17 DIAGNOSIS — I129 Hypertensive chronic kidney disease with stage 1 through stage 4 chronic kidney disease, or unspecified chronic kidney disease: Secondary | ICD-10-CM | POA: Diagnosis not present

## 2020-01-18 DIAGNOSIS — E119 Type 2 diabetes mellitus without complications: Secondary | ICD-10-CM | POA: Diagnosis not present

## 2020-01-21 ENCOUNTER — Other Ambulatory Visit: Payer: Self-pay | Admitting: Nephrology

## 2020-01-21 DIAGNOSIS — Z124 Encounter for screening for malignant neoplasm of cervix: Secondary | ICD-10-CM | POA: Diagnosis not present

## 2020-01-21 DIAGNOSIS — E1122 Type 2 diabetes mellitus with diabetic chronic kidney disease: Secondary | ICD-10-CM | POA: Diagnosis not present

## 2020-01-21 DIAGNOSIS — I10 Essential (primary) hypertension: Secondary | ICD-10-CM | POA: Diagnosis not present

## 2020-01-21 DIAGNOSIS — N184 Chronic kidney disease, stage 4 (severe): Secondary | ICD-10-CM

## 2020-01-28 DIAGNOSIS — E7849 Other hyperlipidemia: Secondary | ICD-10-CM | POA: Diagnosis not present

## 2020-01-28 DIAGNOSIS — E1122 Type 2 diabetes mellitus with diabetic chronic kidney disease: Secondary | ICD-10-CM | POA: Diagnosis not present

## 2020-01-28 DIAGNOSIS — I10 Essential (primary) hypertension: Secondary | ICD-10-CM | POA: Diagnosis not present

## 2020-01-29 DIAGNOSIS — R809 Proteinuria, unspecified: Secondary | ICD-10-CM | POA: Diagnosis not present

## 2020-01-29 DIAGNOSIS — I129 Hypertensive chronic kidney disease with stage 1 through stage 4 chronic kidney disease, or unspecified chronic kidney disease: Secondary | ICD-10-CM | POA: Diagnosis not present

## 2020-01-29 DIAGNOSIS — N184 Chronic kidney disease, stage 4 (severe): Secondary | ICD-10-CM | POA: Diagnosis not present

## 2020-01-29 DIAGNOSIS — D631 Anemia in chronic kidney disease: Secondary | ICD-10-CM | POA: Diagnosis not present

## 2020-01-30 ENCOUNTER — Ambulatory Visit
Admission: RE | Admit: 2020-01-30 | Discharge: 2020-01-30 | Disposition: A | Discharge status: Home / Self Care | Payer: BC Managed Care – PPO | Source: Ambulatory Visit | Attending: Nephrology | Admitting: Nephrology

## 2020-01-30 DIAGNOSIS — N184 Chronic kidney disease, stage 4 (severe): Secondary | ICD-10-CM

## 2020-01-30 DIAGNOSIS — N189 Chronic kidney disease, unspecified: Secondary | ICD-10-CM | POA: Diagnosis not present

## 2020-01-30 DIAGNOSIS — D259 Leiomyoma of uterus, unspecified: Secondary | ICD-10-CM | POA: Diagnosis not present

## 2020-02-17 DIAGNOSIS — D631 Anemia in chronic kidney disease: Secondary | ICD-10-CM | POA: Diagnosis not present

## 2020-02-17 DIAGNOSIS — I129 Hypertensive chronic kidney disease with stage 1 through stage 4 chronic kidney disease, or unspecified chronic kidney disease: Secondary | ICD-10-CM | POA: Diagnosis not present

## 2020-02-17 DIAGNOSIS — R809 Proteinuria, unspecified: Secondary | ICD-10-CM | POA: Diagnosis not present

## 2020-02-17 DIAGNOSIS — A599 Trichomoniasis, unspecified: Secondary | ICD-10-CM | POA: Diagnosis not present

## 2020-02-17 DIAGNOSIS — N184 Chronic kidney disease, stage 4 (severe): Secondary | ICD-10-CM | POA: Diagnosis not present

## 2020-02-18 DIAGNOSIS — E119 Type 2 diabetes mellitus without complications: Secondary | ICD-10-CM | POA: Diagnosis not present

## 2020-02-19 ENCOUNTER — Ambulatory Visit: Payer: BC Managed Care – PPO | Admitting: Obstetrics and Gynecology

## 2020-02-20 ENCOUNTER — Other Ambulatory Visit (HOSPITAL_COMMUNITY): Payer: Self-pay | Admitting: Nephrology

## 2020-02-20 DIAGNOSIS — R768 Other specified abnormal immunological findings in serum: Secondary | ICD-10-CM

## 2020-02-20 DIAGNOSIS — R809 Proteinuria, unspecified: Secondary | ICD-10-CM

## 2020-02-26 ENCOUNTER — Other Ambulatory Visit: Payer: Self-pay | Admitting: Student

## 2020-02-26 ENCOUNTER — Other Ambulatory Visit: Payer: Self-pay | Admitting: Radiology

## 2020-02-26 ENCOUNTER — Other Ambulatory Visit (HOSPITAL_COMMUNITY): Payer: Self-pay | Admitting: Physician Assistant

## 2020-02-27 ENCOUNTER — Encounter (HOSPITAL_COMMUNITY): Payer: Self-pay

## 2020-02-27 ENCOUNTER — Ambulatory Visit (HOSPITAL_COMMUNITY): Admission: RE | Admit: 2020-02-27 | Payer: BC Managed Care – PPO | Source: Ambulatory Visit

## 2020-03-03 ENCOUNTER — Ambulatory Visit: Payer: BC Managed Care – PPO | Admitting: Nurse Practitioner

## 2020-03-11 DIAGNOSIS — E7849 Other hyperlipidemia: Secondary | ICD-10-CM | POA: Diagnosis not present

## 2020-03-11 DIAGNOSIS — E1122 Type 2 diabetes mellitus with diabetic chronic kidney disease: Secondary | ICD-10-CM | POA: Diagnosis not present

## 2020-03-11 DIAGNOSIS — I10 Essential (primary) hypertension: Secondary | ICD-10-CM | POA: Diagnosis not present

## 2020-03-11 DIAGNOSIS — N184 Chronic kidney disease, stage 4 (severe): Secondary | ICD-10-CM | POA: Diagnosis not present

## 2020-03-20 DIAGNOSIS — E119 Type 2 diabetes mellitus without complications: Secondary | ICD-10-CM | POA: Diagnosis not present

## 2020-03-29 DIAGNOSIS — Z794 Long term (current) use of insulin: Secondary | ICD-10-CM | POA: Diagnosis not present

## 2020-03-29 DIAGNOSIS — K0889 Other specified disorders of teeth and supporting structures: Secondary | ICD-10-CM | POA: Diagnosis not present

## 2020-03-29 DIAGNOSIS — E119 Type 2 diabetes mellitus without complications: Secondary | ICD-10-CM | POA: Diagnosis not present

## 2020-03-29 DIAGNOSIS — I509 Heart failure, unspecified: Secondary | ICD-10-CM | POA: Diagnosis not present

## 2020-03-29 DIAGNOSIS — Z79899 Other long term (current) drug therapy: Secondary | ICD-10-CM | POA: Diagnosis not present

## 2020-03-29 DIAGNOSIS — K029 Dental caries, unspecified: Secondary | ICD-10-CM | POA: Diagnosis not present

## 2020-03-29 DIAGNOSIS — I110 Hypertensive heart disease with heart failure: Secondary | ICD-10-CM | POA: Diagnosis not present

## 2020-03-29 DIAGNOSIS — R220 Localized swelling, mass and lump, head: Secondary | ICD-10-CM | POA: Diagnosis not present

## 2020-03-30 DIAGNOSIS — D631 Anemia in chronic kidney disease: Secondary | ICD-10-CM | POA: Diagnosis not present

## 2020-03-30 DIAGNOSIS — I129 Hypertensive chronic kidney disease with stage 1 through stage 4 chronic kidney disease, or unspecified chronic kidney disease: Secondary | ICD-10-CM | POA: Diagnosis not present

## 2020-03-30 DIAGNOSIS — R809 Proteinuria, unspecified: Secondary | ICD-10-CM | POA: Diagnosis not present

## 2020-03-30 DIAGNOSIS — N184 Chronic kidney disease, stage 4 (severe): Secondary | ICD-10-CM | POA: Diagnosis not present

## 2020-04-17 DIAGNOSIS — E119 Type 2 diabetes mellitus without complications: Secondary | ICD-10-CM | POA: Diagnosis not present

## 2020-04-20 DIAGNOSIS — E119 Type 2 diabetes mellitus without complications: Secondary | ICD-10-CM | POA: Diagnosis not present

## 2020-04-29 DIAGNOSIS — I129 Hypertensive chronic kidney disease with stage 1 through stage 4 chronic kidney disease, or unspecified chronic kidney disease: Secondary | ICD-10-CM | POA: Diagnosis not present

## 2020-04-29 DIAGNOSIS — D631 Anemia in chronic kidney disease: Secondary | ICD-10-CM | POA: Diagnosis not present

## 2020-04-29 DIAGNOSIS — R809 Proteinuria, unspecified: Secondary | ICD-10-CM | POA: Diagnosis not present

## 2020-04-29 DIAGNOSIS — N184 Chronic kidney disease, stage 4 (severe): Secondary | ICD-10-CM | POA: Diagnosis not present

## 2020-08-20 ENCOUNTER — Other Ambulatory Visit (HOSPITAL_COMMUNITY): Payer: Self-pay | Admitting: Nephrology

## 2020-08-20 DIAGNOSIS — R809 Proteinuria, unspecified: Secondary | ICD-10-CM

## 2020-08-20 DIAGNOSIS — R768 Other specified abnormal immunological findings in serum: Secondary | ICD-10-CM

## 2020-09-07 ENCOUNTER — Other Ambulatory Visit: Payer: Self-pay | Admitting: Radiology

## 2020-09-08 ENCOUNTER — Encounter: Payer: Self-pay | Admitting: Radiology

## 2020-09-08 ENCOUNTER — Other Ambulatory Visit: Payer: Self-pay

## 2020-09-08 ENCOUNTER — Ambulatory Visit (HOSPITAL_COMMUNITY)
Admission: RE | Admit: 2020-09-08 | Discharge: 2020-09-08 | Disposition: A | Discharge status: Home / Self Care | Payer: No Typology Code available for payment source | Source: Ambulatory Visit | Attending: Nephrology | Admitting: Nephrology

## 2020-09-08 ENCOUNTER — Encounter (HOSPITAL_COMMUNITY): Payer: Self-pay

## 2020-09-08 DIAGNOSIS — N189 Chronic kidney disease, unspecified: Secondary | ICD-10-CM | POA: Insufficient documentation

## 2020-09-08 DIAGNOSIS — R768 Other specified abnormal immunological findings in serum: Secondary | ICD-10-CM | POA: Diagnosis present

## 2020-09-08 DIAGNOSIS — R809 Proteinuria, unspecified: Secondary | ICD-10-CM | POA: Insufficient documentation

## 2020-09-08 HISTORY — DX: Type 2 diabetes mellitus without complications: E11.9

## 2020-09-08 HISTORY — DX: Essential (primary) hypertension: I10

## 2020-09-08 HISTORY — DX: Chronic kidney disease, unspecified: N18.9

## 2020-09-08 LAB — CBC
HCT: 37 % (ref 36.0–46.0)
Hemoglobin: 11.2 g/dL — ABNORMAL LOW (ref 12.0–15.0)
MCH: 24.7 pg — ABNORMAL LOW (ref 26.0–34.0)
MCHC: 30.3 g/dL (ref 30.0–36.0)
MCV: 81.5 fL (ref 80.0–100.0)
Platelets: 393 10*3/uL (ref 150–400)
RBC: 4.54 MIL/uL (ref 3.87–5.11)
RDW: 15.1 % (ref 11.5–15.5)
WBC: 10.1 10*3/uL (ref 4.0–10.5)
nRBC: 0 % (ref 0.0–0.2)

## 2020-09-08 LAB — PREGNANCY, URINE: Preg Test, Ur: NEGATIVE

## 2020-09-08 LAB — PROTIME-INR
INR: 0.9 (ref 0.8–1.2)
Prothrombin Time: 12.6 seconds (ref 11.4–15.2)

## 2020-09-08 LAB — GLUCOSE, CAPILLARY: Glucose-Capillary: 102 mg/dL — ABNORMAL HIGH (ref 70–99)

## 2020-09-08 MED ORDER — SODIUM CHLORIDE 0.9 % IV SOLN: INTRAVENOUS | Status: DC

## 2020-09-08 NOTE — Progress Notes (Signed)
Procedure cancelled due to high blood pressure. Pt to contact her physician regarding blood pressure reading. Will reschedule

## 2020-09-08 NOTE — H&P (Signed)
Chief Complaint: Patient was seen in consultation today for random renal biopsy at the request of [name] C  Referring Physician(s): Claudia Desanctis  Supervising Physician: Michaelle Birks  Patient Status: [name] - Out-pt  History of Present Illness: Lindsay Tucker is a 35 y.o. female   Hx HTN; DM; HLD High Creatinine over last year Known CKD 4 Follows with Dr [name]  + ANA Proteinuria Request made for random renal biopsy per Renal team  BP still high today even after all BP meds this am (230/108)  Past Medical History:  Diagnosis Date   CKD (chronic kidney disease)    Diabetes mellitus without complication ([place])    Hypertension     History reviewed. No pertinent surgical history.  Allergies: Patient has no known allergies.  Medications: Prior to Admission medications   Medication Sig Start Date End Date Taking? Authorizing Provider  amLODipine (NORVASC) 10 MG tablet Take 10 mg by mouth daily.   Yes [provider]  cloNIDine (CATAPRES - DOSED IN MG/24 HR) 0.2 mg/24hr patch Place 0.2 mg onto the skin every Friday.   Yes [provider]  docusate sodium (COLACE) 100 MG capsule Take 100 mg by mouth 2 (two) times daily.   Yes [provider]  Dulaglutide (TRULICITY) 1.5 0000000 SOPN Inject 1.5 mg into the skin every Thursday.   Yes [provider]  furosemide (LASIX) 40 MG tablet Take 40 mg by mouth every Monday, Wednesday, and Friday.   Yes [provider]  hydrALAZINE (APRESOLINE) 100 MG tablet Take 100 mg by mouth 3 (three) times daily.   Yes [provider]  insulin NPH-regular Human (70-30) 100 UNIT/ML injection Inject 40 Units into the skin 2 (two) times daily with a meal.   Yes [provider]  isosorbide mononitrate (IMDUR) 30 MG 24 hr tablet Take 30 mg by mouth daily.   Yes [provider]  labetalol (NORMODYNE) 200 MG tablet Take 200 mg by mouth 2 (two) times daily.    Yes [provider]  losartan-hydrochlorothiazide (HYZAAR) 100-25 MG tablet Take 1 tablet by mouth daily.   Yes [provider]  spironolactone (ALDACTONE) 25 MG tablet Take 25 mg by mouth daily.   Yes [provider]     History reviewed. No pertinent family history.  Social History   Socioeconomic History   Marital status: Married    Spouse name: Not on file   Number of children: Not on file   Years of education: Not on file   Highest education level: Not on file  Occupational History   Not on file  Tobacco Use   Smoking status: Never   Smokeless tobacco: Never  Vaping Use   Vaping Use: Never used  Substance and Sexual Activity   Alcohol use: Yes    Comment: social   Drug use: Yes    Types: Marijuana   Sexual activity: Not on file  Other Topics Concern   Not on file  Social History Narrative   Not on file   Social Determinants of Health   Financial Resource Strain: Not on file  Food Insecurity: Not on file  Transportation Needs: Not on file  Physical Activity: Not on file  Stress: Not on file  Social Connections: Not on file    Review of Systems: A 12 point ROS discussed and pertinent positives are indicated in the HPI above.  All other systems are negative.  Review of Systems  Constitutional:  Negative for activity change,  fatigue, fever and unexpected weight change.  Respiratory:  Negative for cough and shortness of breath.   Cardiovascular:  Negative for chest pain.  Gastrointestinal:  Negative for abdominal pain, nausea and vomiting.  Psychiatric/Behavioral:  Negative for behavioral problems and confusion.    Vital Signs: BP (!) 230/108   Pulse 92   Temp 98 F (36.7 C) (Oral)   Resp 20   Ht '5\' 6"'$  (1.676 m)   Wt 267 lb (121.1 kg)   LMP 08/10/2020   SpO2 100%   BMI 43.09 kg/m   Physical Exam Vitals reviewed.  Constitutional:      Appearance: She is obese.  HENT:     Mouth/Throat:     Mouth: Mucous membranes are  moist.  Cardiovascular:     Rate and Rhythm: Normal rate and regular rhythm.     Heart sounds: Normal heart sounds.  Pulmonary:     Effort: Pulmonary effort is normal.     Breath sounds: Normal breath sounds.  Abdominal:     Palpations: Abdomen is soft.     Tenderness: There is no abdominal tenderness.  Musculoskeletal:        General: No swelling or tenderness. Normal range of motion.     Right lower leg: No edema.     Left lower leg: No edema.  Skin:    General: Skin is warm.  Neurological:     Mental Status: She is alert and oriented to person, place, and time.  Psychiatric:        Behavior: Behavior normal.    Imaging: No results found.  Labs:  CBC: Recent Labs    09/08/20 0625  WBC 10.1  HGB 11.2*  HCT 37.0  PLT 393    COAGS: No results for input(s): INR, APTT in the last 8760 hours.  BMP: No results for input(s): NA, K, CL, CO2, GLUCOSE, BUN, CALCIUM, CREATININE, GFRNONAA, GFRAA in the last 8760 hours.  Invalid input(s): CMP  LIVER FUNCTION TESTS: No results for input(s): BILITOT, AST, ALT, ALKPHOS, PROT, ALBUMIN in the last 8760 hours.  TUMOR MARKERS: No results for input(s): AFPTM, CEA, CA199, CHROMGRNA in the last 8760 hours.  Assessment and Plan:  CKD stage 4 Rising creatinine Proteinuria +ANA DM; HLD Hypertension 230/108 this am (even after all BP meds this am) Scheduled today for random renal biopsy per Dr Royce Macadamia Risks and benefits of random renal biopsy was discussed with the patient and/or patient's family including, but not limited to bleeding, infection, damage to adjacent structures or low yield requiring additional tests.  All of the questions were answered and there is agreement to proceed. Consent signed and in chart.   High BP was discussed with pt- may not be able to move forward safely with Bx today. Will discuss with IR Rad She is aware and agreeable to any recommendation/plan.   Thank you for this interesting consult.  I  greatly enjoyed meeting [employer] and look forward to participating in their care.  A copy of this report was sent to the requesting provider on this date.  Electronically Signed: Lavonia Drafts, PA-C 09/08/2020, 7:10 AM   I spent a total of  30 Minutes   in face to face in clinical consultation, greater than 50% of which was counseling/coordinating care for random renal biopsy

## 2020-09-08 NOTE — Progress Notes (Signed)
   Dr Maryelizabeth Kaufmann has cancelled procedure secondary to very high BP Pt has taken all BP meds this am and still at 230/108 Unsafe to move forward with biopsy  Pt is aware and agreeable  I will send this note to Dr Royce Macadamia Please reschedule when BP is under better control Needs to be equal or less than 130/90   Pt sent home today No biopsy performed

## 2020-09-09 ENCOUNTER — Encounter (HOSPITAL_COMMUNITY): Payer: Self-pay | Admitting: Emergency Medicine

## 2020-09-09 ENCOUNTER — Emergency Department (HOSPITAL_COMMUNITY)
Admission: EM | Admit: 2020-09-09 | Discharge: 2020-09-09 | Disposition: A | Discharge status: Home / Self Care | Payer: No Typology Code available for payment source | Attending: Emergency Medicine | Admitting: Emergency Medicine

## 2020-09-09 DIAGNOSIS — R030 Elevated blood-pressure reading, without diagnosis of hypertension: Secondary | ICD-10-CM | POA: Diagnosis present

## 2020-09-09 DIAGNOSIS — N189 Chronic kidney disease, unspecified: Secondary | ICD-10-CM | POA: Insufficient documentation

## 2020-09-09 DIAGNOSIS — E1122 Type 2 diabetes mellitus with diabetic chronic kidney disease: Secondary | ICD-10-CM | POA: Diagnosis not present

## 2020-09-09 DIAGNOSIS — I129 Hypertensive chronic kidney disease with stage 1 through stage 4 chronic kidney disease, or unspecified chronic kidney disease: Secondary | ICD-10-CM | POA: Insufficient documentation

## 2020-09-09 DIAGNOSIS — Z79899 Other long term (current) drug therapy: Secondary | ICD-10-CM | POA: Diagnosis not present

## 2020-09-09 DIAGNOSIS — Z794 Long term (current) use of insulin: Secondary | ICD-10-CM | POA: Diagnosis not present

## 2020-09-09 DIAGNOSIS — I10 Essential (primary) hypertension: Secondary | ICD-10-CM

## 2020-09-09 MED ORDER — SPIRONOLACTONE 25 MG PO TABS: 25.0000 mg | ORAL_TABLET | Freq: Two times a day (BID) | ORAL | 0 refills | Status: DC

## 2020-09-09 NOTE — Discharge Instructions (Addendum)
Please keep logs of blood pressures and when you take your medications.  Call your kidney doctor for recheck in the next 1 to 2 weeks. Increase your spironolactone from 1 a day to 2 a day.  Your prescription has been rewritten to reflect this.

## 2020-09-09 NOTE — ED Triage Notes (Signed)
Pt sent due to HTN and needing kidney biopsy. States they were unable to perform the procedure because BP was 230/108.

## 2020-09-09 NOTE — ED Provider Notes (Signed)
[facility] Provider Note   CSN: [id number] Arrival date & time: 09/09/20  1249     History Chief Complaint  Patient presents with   Hypertension    Lind[facility] is a 35 y.o. female.  HPI 35 year old female CKD, hypertension, diabetes presents today complaining of high blood pressure.  She was presenting to IR yesterday for a renal biopsy, but was discharged without the biopsy being performed due to systolic blood pressure of 230.  She was told to come to the emergency department for follow-up.  She presents today due to of these instructions.  Here her systolic blood pressure has been 1 43-1 52.  She denies any headache, chest pain, dyspnea, changes in urination.  Mr. Lindsay Tucker, [place] spoke with her nephrologist and advised to increase her spironolactone from 25 a day to 50 a day.     Past Medical History:  Diagnosis Date   CKD (chronic kidney disease)    Diabetes mellitus without complication ([place])    Hypertension     Patient Active Problem List   Diagnosis Date Noted   CKD (chronic kidney disease) 09/08/2020   OBESITY, NOS 04/13/2006   RHINITIS, ALLERGIC 04/13/2006    History reviewed. No pertinent surgical history.   OB History   No obstetric history on file.     History reviewed. No pertinent family history.  Social History   Tobacco Use   Smoking status: Never   Smokeless tobacco: Never  Vaping Use   Vaping Use: Never used  Substance Use Topics   Alcohol use: Yes    Comment: social   Drug use: Yes    Types: Marijuana    Home Medications Prior to Admission medications   Medication Sig Start Date End Date Taking? Authorizing Provider  amLODipine (NORVASC) 10 MG tablet Take 10 mg by mouth daily.    [provider]  cloNIDine (CATAPRES - DOSED IN MG/24 HR) 0.2 mg/24hr patch Place 0.2 mg onto the skin every Friday.    [provider]  docusate sodium (COLACE) 100 MG capsule Take 100 mg  by mouth 2 (two) times daily.    [provider]  Dulaglutide (TRULICITY) 1.5 0000000 SOPN Inject 1.5 mg into the skin every Thursday.    [provider]  furosemide (LASIX) 40 MG tablet Take 40 mg by mouth every Monday, Wednesday, and Friday.    [provider]  hydrALAZINE (APRESOLINE) 100 MG tablet Take 100 mg by mouth 3 (three) times daily.    [provider]  insulin NPH-regular Human (70-30) 100 UNIT/ML injection Inject 40 Units into the skin 2 (two) times daily with a meal.    [provider]  isosorbide mononitrate (IMDUR) 30 MG 24 hr tablet Take 30 mg by mouth daily.    [provider]  labetalol (NORMODYNE) 200 MG tablet Take 200 mg by mouth 2 (two) times daily.    [provider]  losartan-hydrochlorothiazide (HYZAAR) 100-25 MG tablet Take 1 tablet by mouth daily.    [provider]  spironolactone (ALDACTONE) 25 MG tablet Take 25 mg by mouth daily.    [provider]    Allergies    Patient has no known allergies.  Review of Systems   Review of Systems  All other systems reviewed and are negative.  Physical Exam Updated Vital Signs BP (!) 152/81   Pulse 89   Temp 98.4 F (36.9 C) (Oral)   Resp 20   Ht 1.676 m (  $'5\' 6"'k$ )   Wt 121 kg   LMP 08/10/2020   SpO2 100%   BMI 43.06 kg/m   Physical Exam Vitals and nursing note reviewed.  Constitutional:      General: She is not in acute distress.    Appearance: Normal appearance. She is obese. She is ill-appearing.  HENT:     Head: Normocephalic.     Right Ear: External ear normal.     Left Ear: External ear normal.     Nose: Nose normal.     Mouth/Throat:     Pharynx: Oropharynx is clear.  Eyes:     Pupils: Pupils are equal, round, and reactive to light.  Cardiovascular:     Rate and Rhythm: Normal rate.  Pulmonary:     Effort: Pulmonary effort is normal.  Abdominal:     General: Abdomen is flat.     Palpations: Abdomen is soft.   Musculoskeletal:        General: Normal range of motion.     Cervical back: Normal range of motion.  Skin:    General: Skin is warm and dry.     Capillary Refill: Capillary refill takes less than 2 seconds.  Neurological:     General: No focal deficit present.     Mental Status: She is alert.  Psychiatric:        Mood and Affect: Mood normal.    ED Results / Procedures / Treatments   Labs (all labs ordered are listed, but only abnormal results are displayed) Labs Reviewed - No data to display  EKG None  Radiology No results found.  Procedures Procedures   Medications Ordered in ED Medications - No data to display  ED Course  I have reviewed the triage vital signs and the nursing notes.  Pertinent labs & imaging results that were available during my care of the patient were reviewed by me and considered in my medical decision making (see chart for details).    MDM Rules/Calculators/A&P                           Increase spironolactone from 25 daily to 50 daily per Dr. Royce Macadamia Final Clinical Impression(s) / ED Diagnoses Final diagnoses:  Hypertension, unspecified type    Rx / DC Orders ED Discharge Orders     None        Pattricia Boss, MD 09/09/20 [phone]

## 2020-09-09 NOTE — ED Provider Notes (Addendum)
Emergency Medicine Provider Triage Evaluation Note  Lindsay Tucker , a 35 y.o. female  was evaluated in triage.  Pt complains of high blood pressure, patient seen here on the request by her nephrologist Dr Royce Macadamia, she was seen in IR yesterday for a renal biopsy, it was noted that she had a pressure of 230/110 despite taking all her blood pressure medications, they had to cancel the procedure as it was unsafe to proceed.  She was sent home.  She was counseled by her nephrologist today to be reassessed in the ED.  She states that she has no complaints this time, she denies headaches, change in vision, paresthesias or weakness in her  upper or lower extremities, denies chest pain, shortness of breath, abdominal pain.  States that she took all of her blood pressure medication today, her blood pressure is 152/81.   Addendum -spoke with Dr. Royce Macadamia nephrology, she sent her here because she was concerned that the patient's blood pressure would remained significantly elevated as it was yesterday.  She states that if it is truly back to normal this something that she could handle, if possible she would like to try to see if IR could perform the renal biopsy.  You have any further questions you may contact Dr. Royce Macadamia directly  Review of Systems  Positive: High blood pressure Negative: Headaches, paresthesias  Physical Exam  BP (!) 152/81   Pulse 89   Temp 98.4 F (36.9 C) (Oral)   Resp 20   Ht '5\' 6"'$  (1.676 m)   Wt 121 kg   LMP 08/10/2020   SpO2 100%   BMI 43.06 kg/m  Gen:   Awake, no distress   Resp:  Normal effort  MSK:   Moves extremities without difficulty  Other:  No facial asymmetry, no difficulty with word finding, able to follow commands, no slurring of words, no unilateral weakness  Medical Decision Making  Medically screening exam initiated at 2:11 PM.  Appropriate orders placed.  Claudette Stapler Mitcham was informed that the remainder of the evaluation will be completed by  another provider, this initial triage assessment does not replace that evaluation, and the importance of remaining in the ED until their evaluation is complete.  Presents with high blood pressure, patient further work-up.   Marcello Fennel, PA-C 09/09/20 1413    Marcello Fennel, PA-C 09/09/20 1435    Marcello Fennel, PA-C 09/09/20 1612    Elnora Morrison, MD 09/10/20 1729

## 2020-09-28 ENCOUNTER — Other Ambulatory Visit: Payer: Self-pay | Admitting: Internal Medicine

## 2020-09-28 ENCOUNTER — Other Ambulatory Visit: Payer: Self-pay | Admitting: Student

## 2020-09-29 ENCOUNTER — Ambulatory Visit (HOSPITAL_COMMUNITY): Admission: RE | Admit: 2020-09-29 | Payer: No Typology Code available for payment source | Source: Ambulatory Visit

## 2020-09-29 ENCOUNTER — Encounter (HOSPITAL_COMMUNITY): Payer: Self-pay

## 2020-12-02 ENCOUNTER — Other Ambulatory Visit (HOSPITAL_COMMUNITY): Payer: Self-pay | Admitting: Nephrology

## 2020-12-02 DIAGNOSIS — N184 Chronic kidney disease, stage 4 (severe): Secondary | ICD-10-CM

## 2020-12-03 ENCOUNTER — Inpatient Hospital Stay (HOSPITAL_COMMUNITY): Admission: RE | Admit: 2020-12-03 | Payer: No Typology Code available for payment source | Source: Ambulatory Visit

## 2021-01-12 ENCOUNTER — Encounter (HOSPITAL_COMMUNITY): Payer: Self-pay

## 2021-03-03 ENCOUNTER — Ambulatory Visit (HOSPITAL_COMMUNITY): Admission: RE | Admit: 2021-03-03 | Payer: Self-pay | Source: Ambulatory Visit

## 2021-03-03 ENCOUNTER — Other Ambulatory Visit (HOSPITAL_COMMUNITY): Payer: Self-pay | Admitting: Internal Medicine

## 2021-03-03 DIAGNOSIS — I10 Essential (primary) hypertension: Secondary | ICD-10-CM

## 2021-03-04 ENCOUNTER — Other Ambulatory Visit (HOSPITAL_COMMUNITY): Payer: Self-pay | Admitting: Nephrology

## 2021-03-04 DIAGNOSIS — I129 Hypertensive chronic kidney disease with stage 1 through stage 4 chronic kidney disease, or unspecified chronic kidney disease: Secondary | ICD-10-CM

## 2021-03-10 ENCOUNTER — Encounter (HOSPITAL_COMMUNITY): Payer: No Typology Code available for payment source

## 2021-03-11 ENCOUNTER — Inpatient Hospital Stay (HOSPITAL_COMMUNITY): Admission: RE | Admit: 2021-03-11 | Payer: Self-pay | Source: Ambulatory Visit

## 2021-03-24 ENCOUNTER — Other Ambulatory Visit (HOSPITAL_COMMUNITY): Payer: Self-pay | Admitting: *Deleted

## 2021-03-25 ENCOUNTER — Encounter (HOSPITAL_COMMUNITY): Payer: Self-pay

## 2021-03-25 ENCOUNTER — Inpatient Hospital Stay (HOSPITAL_COMMUNITY): Admission: RE | Admit: 2021-03-25 | Payer: 59 | Source: Ambulatory Visit

## 2021-05-05 ENCOUNTER — Other Ambulatory Visit: Payer: Self-pay

## 2021-05-05 ENCOUNTER — Encounter: Payer: 59 | Admitting: Obstetrics and Gynecology

## 2021-05-05 NOTE — Progress Notes (Signed)
Patient presents as a New Patient for AEX.  ? ?Patient sent to ER for Sever Hypertension uncontrolled with medications. Patient verbalized understanding. ? ?Patient not seen by provider for this visit. ?

## 2021-05-08 NOTE — Progress Notes (Signed)
Chart reviewed for nurse visit. Agree with plan of care.  ? ?Lezlie Lye, NP ?05/08/2021 10:06 AM   ?

## 2021-05-12 ENCOUNTER — Other Ambulatory Visit: Payer: Self-pay

## 2021-05-12 ENCOUNTER — Ambulatory Visit (HOSPITAL_COMMUNITY)
Admission: RE | Admit: 2021-05-12 | Discharge: 2021-05-12 | Disposition: A | Discharge status: Home / Self Care | Payer: 59 | Source: Ambulatory Visit | Attending: Nephrology | Admitting: Nephrology

## 2021-05-12 DIAGNOSIS — D631 Anemia in chronic kidney disease: Secondary | ICD-10-CM | POA: Diagnosis not present

## 2021-05-12 MED ORDER — SODIUM CHLORIDE 0.9 % IV SOLN
510.0000 mg | Freq: Once | INTRAVENOUS | Status: AC
  Administered 2021-05-12: 510 mg via INTRAVENOUS
  Filled 2021-05-12: qty 510

## 2021-07-22 ENCOUNTER — Other Ambulatory Visit (HOSPITAL_COMMUNITY): Payer: Self-pay | Admitting: *Deleted

## 2021-07-23 ENCOUNTER — Encounter (HOSPITAL_COMMUNITY)
Admission: RE | Admit: 2021-07-23 | Discharge: 2021-07-23 | Disposition: A | Discharge status: Home / Self Care | Payer: 59 | Source: Ambulatory Visit | Attending: Nephrology | Admitting: Nephrology

## 2021-07-23 DIAGNOSIS — D631 Anemia in chronic kidney disease: Secondary | ICD-10-CM | POA: Insufficient documentation

## 2021-07-23 DIAGNOSIS — N189 Chronic kidney disease, unspecified: Secondary | ICD-10-CM | POA: Insufficient documentation

## 2021-07-23 MED ORDER — SODIUM CHLORIDE 0.9 % IV SOLN
510.0000 mg | INTRAVENOUS | Status: DC
  Administered 2021-07-23: 510 mg via INTRAVENOUS
  Filled 2021-07-23: qty 510

## 2021-07-30 ENCOUNTER — Encounter (HOSPITAL_COMMUNITY): Payer: Self-pay | Admitting: *Deleted

## 2021-07-30 ENCOUNTER — Encounter (HOSPITAL_COMMUNITY)
Admission: RE | Admit: 2021-07-30 | Discharge: 2021-07-30 | Disposition: A | Discharge status: Home / Self Care | Payer: 59 | Source: Ambulatory Visit | Attending: Nephrology | Admitting: Nephrology

## 2021-07-30 DIAGNOSIS — N189 Chronic kidney disease, unspecified: Secondary | ICD-10-CM | POA: Diagnosis not present

## 2021-07-30 MED ORDER — SODIUM CHLORIDE 0.9 % IV SOLN
510.0000 mg | INTRAVENOUS | Status: AC
  Administered 2021-07-30: 510 mg via INTRAVENOUS
  Filled 2021-07-30: qty 510

## 2021-09-09 ENCOUNTER — Other Ambulatory Visit (HOSPITAL_COMMUNITY): Payer: Self-pay | Admitting: *Deleted

## 2021-09-10 ENCOUNTER — Ambulatory Visit (HOSPITAL_COMMUNITY)
Admission: RE | Admit: 2021-09-10 | Discharge: 2021-09-10 | Disposition: A | Discharge status: Home / Self Care | Payer: 59 | Source: Ambulatory Visit | Attending: Nephrology | Admitting: Nephrology

## 2021-09-10 DIAGNOSIS — N189 Chronic kidney disease, unspecified: Secondary | ICD-10-CM | POA: Insufficient documentation

## 2021-09-10 DIAGNOSIS — D631 Anemia in chronic kidney disease: Secondary | ICD-10-CM | POA: Insufficient documentation

## 2021-09-10 MED ORDER — SODIUM CHLORIDE 0.9 % IV SOLN
510.0000 mg | Freq: Once | INTRAVENOUS | Status: AC
  Administered 2021-09-10: 510 mg via INTRAVENOUS
  Filled 2021-09-10: qty 510

## 2021-09-13 ENCOUNTER — Other Ambulatory Visit: Payer: Self-pay | Admitting: *Deleted

## 2021-09-13 DIAGNOSIS — N186 End stage renal disease: Secondary | ICD-10-CM

## 2021-10-11 ENCOUNTER — Encounter: Payer: 59 | Admitting: Surgery

## 2021-10-11 ENCOUNTER — Ambulatory Visit (HOSPITAL_COMMUNITY): Payer: 59 | Attending: Vascular Surgery

## 2021-10-11 ENCOUNTER — Ambulatory Visit (HOSPITAL_COMMUNITY): Payer: 59

## 2021-11-03 ENCOUNTER — Ambulatory Visit (HOSPITAL_COMMUNITY)
Admission: RE | Admit: 2021-11-03 | Discharge: 2021-11-03 | Disposition: A | Discharge status: Home / Self Care | Payer: 59 | Source: Ambulatory Visit | Attending: Surgery | Admitting: Surgery

## 2021-11-03 ENCOUNTER — Ambulatory Visit (INDEPENDENT_AMBULATORY_CARE_PROVIDER_SITE_OTHER): Payer: 59 | Admitting: Vascular Surgery

## 2021-11-03 ENCOUNTER — Ambulatory Visit (INDEPENDENT_AMBULATORY_CARE_PROVIDER_SITE_OTHER)
Admission: RE | Admit: 2021-11-03 | Discharge: 2021-11-03 | Disposition: A | Discharge status: Home / Self Care | Payer: 59 | Source: Ambulatory Visit | Attending: Surgery | Admitting: Surgery

## 2021-11-03 ENCOUNTER — Encounter: Payer: Self-pay | Admitting: Vascular Surgery

## 2021-11-03 VITALS — BP 128/68 | HR 80 | Temp 98.6°F | Resp 20 | Ht 66.0 in | Wt 274.0 lb

## 2021-11-03 DIAGNOSIS — N184 Chronic kidney disease, stage 4 (severe): Secondary | ICD-10-CM | POA: Diagnosis not present

## 2021-11-03 DIAGNOSIS — N186 End stage renal disease: Secondary | ICD-10-CM

## 2021-11-03 DIAGNOSIS — Z992 Dependence on renal dialysis: Secondary | ICD-10-CM

## 2021-11-03 NOTE — Progress Notes (Signed)
Patient ID: Lindsay Tucker, female   DOB: 01/07/1986, 36 y.o.   MRN: [id number]  Reason for Consult: New Patient (Initial Visit)   Referred by Claudia Desanctis, MD  Subjective:     HPI:  Lindsay Tucker is a 36 y.o. female with history of chronic kidney disease secondary to hypertension and diabetes.  She has never been on dialysis she does have a great aunt on dialysis no other family members.  She is right-hand dominant.  She does work third shift quality control.  No previous chest, breast or shoulder surgeries or injuries.  Past Medical History:  Diagnosis Date   CKD (chronic kidney disease)    Diabetes mellitus without complication ([place])    Hypertension    History reviewed. No pertinent family history. History reviewed. No pertinent surgical history.  Short Social History:  Social History   Tobacco Use   Smoking status: Never   Smokeless tobacco: Never  Substance Use Topics   Alcohol use: Yes    Comment: social    No Known Allergies  Current Outpatient Medications  Medication Sig Dispense Refill   amLODipine (NORVASC) 10 MG tablet Take 10 mg by mouth daily.     docusate sodium (COLACE) 100 MG capsule Take 100 mg by mouth 2 (two) times daily.     Dulaglutide (TRULICITY) 1.5 ER/7.4YC SOPN Inject 1.5 mg into the skin every Thursday.     furosemide (LASIX) 40 MG tablet Take 40 mg by mouth every Monday, Wednesday, and Friday.     hydrALAZINE (APRESOLINE) 100 MG tablet Take 100 mg by mouth 3 (three) times daily.     insulin NPH-regular Human (70-30) 100 UNIT/ML injection Inject 40 Units into the skin 2 (two) times daily with a meal.     isosorbide mononitrate (IMDUR) 30 MG 24 hr tablet Take 30 mg by mouth daily.     labetalol (NORMODYNE) 200 MG tablet Take 200 mg by mouth 2 (two) times daily.     losartan-hydrochlorothiazide (HYZAAR) 100-25 MG tablet Take 1 tablet by mouth daily.     spironolactone (ALDACTONE) 25 MG tablet Take 1 tablet (25 mg total)  by mouth 2 (two) times daily. 60 tablet 0   cloNIDine (CATAPRES - DOSED IN MG/24 HR) 0.2 mg/24hr patch Place 0.2 mg onto the skin every Friday. (Patient not taking: Reported on 11/03/2021)     No current facility-administered medications for this visit.    Review of Systems  Constitutional:  Constitutional negative. HENT: HENT negative.  Eyes: Eyes negative.  Respiratory: Respiratory negative.  Cardiovascular: Cardiovascular negative.  GI: Gastrointestinal negative.  Musculoskeletal: Musculoskeletal negative.  Skin: Skin negative.  Neurological: Neurological negative. Hematologic: Hematologic/lymphatic negative.  Psychiatric: Psychiatric negative.        Objective:  Objective   Vitals:   11/03/21 1138  BP: 128/68  Pulse: 80  Resp: 20  Temp: 98.6 F (37 C)  SpO2: 98%  Weight: 274 lb (124.3 kg)  Height: 5\' 6"  (1.676 m)   Body mass index is 44.22 kg/m.  Physical Exam HENT:     Head: Normocephalic.     Nose: Nose normal.  Eyes:     Pupils: Pupils are equal, round, and reactive to light.  Cardiovascular:     Rate and Rhythm: Normal rate.  Pulmonary:     Effort: Pulmonary effort is normal.  Abdominal:     General: Abdomen is flat.     Palpations: Abdomen is soft.  Musculoskeletal:  General: Normal range of motion.     Cervical back: Neck supple.     Right lower leg: No edema.     Left lower leg: No edema.  Skin:    General: Skin is warm.     Capillary Refill: Capillary refill takes less than 2 seconds.  Neurological:     General: No focal deficit present.     Mental Status: She is alert.  Psychiatric:        Mood and Affect: Mood normal.        Behavior: Behavior normal.        Thought Content: Thought content normal.        Judgment: Judgment normal.     Data: Right Cephalic   Diameter (cm)Depth (cm)Findings   +-----------------+-------------+----------+---------+  Prox upper arm       0.41        0.32               +-----------------+-------------+----------+---------+  Mid upper arm        0.43        0.32   branching  +-----------------+-------------+----------+---------+  Dist upper arm       0.48        0.28              +-----------------+-------------+----------+---------+  Antecubital fossa    0.56        0.19   branching  +-----------------+-------------+----------+---------+  Prox forearm         0.41        0.32   branching  +-----------------+-------------+----------+---------+  Mid forearm          0.42        0.21   branching  +-----------------+-------------+----------+---------+  Dist forearm         0.33        0.21              +-----------------+-------------+----------+---------+   +-----------------+-------------+----------+-------------------------------  ----+  Right Basilic    Diameter (cm)Depth (cm)             Findings                  +-----------------+-------------+----------+-------------------------------  ----+  Prox upper arm       0.48        1.40                                         +-----------------+-------------+----------+-------------------------------  ----+  Mid upper arm        0.48        1.15                                         +-----------------+-------------+----------+-------------------------------  ----+  Dist upper arm       0.52        0.94                                         +-----------------+-------------+----------+-------------------------------  ----+  Antecubital fossa    0.47        0.43                branching                +-----------------+-------------+----------+-------------------------------  ----+  Prox forearm         0.33        0.19    branching and branch connects  to                                                        cephalic V                +-----------------+-------------+----------+-------------------------------  ----+  Mid  forearm          0.30        0.16                                         +-----------------+-------------+----------+-------------------------------  ----+  Distal forearm       0.23        0.19                                         +-----------------+-------------+----------+-------------------------------  ----+   +-----------------+-------------+----------+---------+  Left Cephalic    Diameter (cm)Depth (cm)Findings   +-----------------+-------------+----------+---------+  Prox upper arm       0.42        0.53              +-----------------+-------------+----------+---------+  Mid upper arm        0.41        0.67   branching  +-----------------+-------------+----------+---------+  Dist upper arm       0.45        0.26              +-----------------+-------------+----------+---------+  Antecubital fossa    0.39        0.50   branching  +-----------------+-------------+----------+---------+  Prox forearm         0.37        0.27              +-----------------+-------------+----------+---------+  Mid forearm          0.38        0.29              +-----------------+-------------+----------+---------+  Dist forearm         0.33        0.32   branching  +-----------------+-------------+----------+---------+   +-----------------+-------------+----------+--------+  Left Basilic     Diameter (cm)Depth (cm)Findings  +-----------------+-------------+----------+--------+  Prox upper arm       0.50        1.42             +-----------------+-------------+----------+--------+  Mid upper arm        0.50        1.06             +-----------------+-------------+----------+--------+  Dist upper arm       0.45        0.40             +-----------------+-------------+----------+--------+  Antecubital fossa    0.31        0.16             +-----------------+-------------+----------+--------+  Prox forearm  0.30        0.30             +-----------------+-------------+----------+--------+  Mid forearm          0.23        0.18             +-----------------+-------------+----------+--------+  Distal forearm       0.18        0.16             +-----------------+-------------+----------+--------+      Assessment/Plan:    36 year old female with chronic kidney disease stage V never been on dialysis before.  Plan is for left arm fistula we have been asked to hold on the graft at this time she does appear to have suitable vein on ultrasound today.  I discussed the risk benefits alternatives of proceeding with left arm cephalic or basilic vein fistula as well as the possible need for additional procedures and she demonstrates good understanding we will get her scheduled in the near future.     Waynetta Sandy MD [facility]

## 2021-11-09 ENCOUNTER — Other Ambulatory Visit: Payer: Self-pay

## 2021-11-09 DIAGNOSIS — N185 Chronic kidney disease, stage 5: Secondary | ICD-10-CM

## 2021-11-10 ENCOUNTER — Other Ambulatory Visit (HOSPITAL_COMMUNITY): Payer: Self-pay | Admitting: *Deleted

## 2021-11-12 ENCOUNTER — Ambulatory Visit (HOSPITAL_COMMUNITY)
Admission: RE | Admit: 2021-11-12 | Discharge: 2021-11-12 | Disposition: A | Discharge status: Home / Self Care | Payer: 59 | Source: Ambulatory Visit | Attending: Nephrology | Admitting: Nephrology

## 2021-11-12 DIAGNOSIS — N189 Chronic kidney disease, unspecified: Secondary | ICD-10-CM | POA: Insufficient documentation

## 2021-11-12 DIAGNOSIS — D631 Anemia in chronic kidney disease: Secondary | ICD-10-CM | POA: Diagnosis present

## 2021-11-12 MED ORDER — SODIUM CHLORIDE 0.9 % IV SOLN
510.0000 mg | Freq: Once | INTRAVENOUS | Status: AC
  Administered 2021-11-12: 510 mg via INTRAVENOUS
  Filled 2021-11-12: qty 510

## 2021-11-15 ENCOUNTER — Encounter (HOSPITAL_COMMUNITY): Payer: Self-pay | Admitting: Vascular Surgery

## 2021-11-15 ENCOUNTER — Telehealth: Payer: Self-pay

## 2021-11-15 NOTE — Telephone Encounter (Signed)
Telephone call received from patient requesting to cancel surgery for a left arm AV fistula vs graft with Dr. Donzetta Matters on tomorrow. She stated "I'm not ready. I want to do more research and work things out before doing surgery." Patient will call office when ready to schedule. MD made aware.

## 2021-11-15 NOTE — Progress Notes (Signed)
PCP -  Cardiologist - none Nephrologist - Dr [name]  Chest x-ray - n/a EKG - DOS Stress Test - n/a ECHO - 10/18/17 Cardiac Cath - n/a  ICD Pacemaker/Loop - n/a  Sleep Study -  n/a CPAP - none  Diabetes THE NIGHT BEFORE SURGERY, take 20 units of Insulin NPH Regular Human 70/30 insulin.      THE MORNING OF SURGERY, take 20 units of Insulin NPH Regular Human 70/30 insulin.  If your blood sugar is less than 70 mg/dL, you will need to treat for low blood sugar: Treat a low blood sugar (less than 70 mg/dL) with  cup of clear juice (cranberry or apple), 4 glucose tablets, OR glucose gel. Recheck blood sugar in 15 minutes after treatment (to make sure it is greater than 70 mg/dL). If your blood sugar is not greater than 70 mg/dL on recheck, call [phone] for further instructions.  Anesthesia review: Yes  STOP now taking any Aspirin (unless otherwise instructed by your surgeon), Aleve, Naproxen, Ibuprofen, Motrin, Advil, Goody's, BC's, all herbal medications, fish oil, and all vitamins.   Coronavirus Screening Do you have any of the following symptoms:  Cough yes/no: No Fever (>100.13F)  yes/no: No Runny nose yes/no: No Sore throat yes/no: No Difficulty breathing/shortness of breath  yes/no: No  Have you traveled in the last 14 days and where? yes/no: No  Patient verbalized understanding of instructions that were given via phone.

## 2021-11-16 ENCOUNTER — Ambulatory Visit (HOSPITAL_COMMUNITY): Admission: RE | Admit: 2021-11-16 | Payer: 59 | Source: Home / Self Care | Admitting: Vascular Surgery

## 2021-11-16 HISTORY — DX: Anemia, unspecified: D64.9

## 2021-11-16 HISTORY — DX: Personal history of other medical treatment: Z92.89

## 2021-11-16 SURGERY — ARTERIOVENOUS (AV) FISTULA CREATION
Anesthesia: Choice | Laterality: Left

## 2021-12-30 ENCOUNTER — Other Ambulatory Visit (HOSPITAL_COMMUNITY): Payer: Self-pay

## 2021-12-31 ENCOUNTER — Encounter (HOSPITAL_COMMUNITY): Payer: 59

## 2022-01-04 ENCOUNTER — Ambulatory Visit (HOSPITAL_COMMUNITY)
Admission: RE | Admit: 2022-01-04 | Discharge: 2022-01-04 | Disposition: A | Discharge status: Home / Self Care | Payer: 59 | Source: Ambulatory Visit | Attending: Nephrology | Admitting: Nephrology

## 2022-01-04 DIAGNOSIS — D631 Anemia in chronic kidney disease: Secondary | ICD-10-CM | POA: Insufficient documentation

## 2022-01-04 DIAGNOSIS — N189 Chronic kidney disease, unspecified: Secondary | ICD-10-CM | POA: Insufficient documentation

## 2022-01-04 MED ORDER — SODIUM CHLORIDE 0.9 % IV SOLN
510.0000 mg | Freq: Once | INTRAVENOUS | Status: AC
  Administered 2022-01-04: 510 mg via INTRAVENOUS
  Filled 2022-01-04: qty 510

## 2022-03-02 ENCOUNTER — Telehealth: Payer: Self-pay

## 2022-03-02 NOTE — Telephone Encounter (Signed)
Pt called to r/s her surgery from last October. Pt is aware we will confirm with surgeon first, as to whether she needs to return to office prior to r/s. Pt verbalized understanding and requested a Friday surgery date. No further questions/concerns at this time.

## 2022-03-03 ENCOUNTER — Other Ambulatory Visit: Payer: Self-pay

## 2022-03-03 DIAGNOSIS — N186 End stage renal disease: Secondary | ICD-10-CM

## 2022-03-15 NOTE — Telephone Encounter (Signed)
Spoke with patient and made aware of provider schedule change on 03/25/22. Surgery rescheduled for 04/01/22. Instructions provided, including last dose of Trulicity on 05/24/42 since she normally takes on Monday. Patient verbalized understanding to all instructions.

## 2022-03-30 ENCOUNTER — Encounter (HOSPITAL_COMMUNITY): Payer: Self-pay | Admitting: Vascular Surgery

## 2022-03-30 ENCOUNTER — Other Ambulatory Visit: Payer: Self-pay

## 2022-03-30 NOTE — Progress Notes (Signed)
PCP - Nolene Ebbs, MD Cardiologist - denies Nephrologist - [name], MD  PPM/ICD - denies  Chest x-ray - n/a EKG - 04/01/22 ECHO - 08/14/19  CPAP - n/a  Fasting Blood Sugar - patient is not checking CBG at home Trulicity - last dose - 03/22/22 per patient  Blood Thinner Instructions: n/a Patient was instructed: As of today, STOP taking any Aspirin (unless otherwise instructed by your surgeon) Aleve, Naproxen, Ibuprofen, Motrin, Advil, Goody's, BC's, all herbal medications, fish oil, and all vitamins.   ERAS Protcol - n/a  COVID TEST- n/a  Anesthesia review: yes  Patient verbally denies any shortness of breath, fever, cough and chest pain during phone call   -------------  SDW INSTRUCTIONS given:  Your procedure is scheduled on Friday, February 16th, 2024.  Report to [facility] Main Entrance "A" at 08:45 A.M., and check in at the Admitting office.  Call this number if you have problems the morning of surgery:  [phone]   Remember:  Do not eat or drink after midnight the night before your surgery    Take these medicines the morning of surgery with A SIP OF WATER:  Amlodipine, Hydralazine, Imdur, Labetalol  THE NIGHT BEFORE SURGERY, take 20 units of insulin NPH-regular Human (70-30) (50% of your regular dose.       THE MORNING OF SURGERY, take 20 units of insulin NPH-regular Human (70-30) (50% of your regular dose.      How do I manage my blood sugar before surgery? Check your blood sugar at least 4 times a day, starting 2 days before surgery, to make sure that the level is not too high or low.  Check your blood sugar the morning of your surgery when you wake up and every 2 hours until you get to the Short Stay unit.  If your blood sugar is less than 70 mg/dL, you will need to treat for low blood sugar: Do not take insulin. Treat a low blood sugar (less than 70 mg/dL) with  cup of clear juice (cranberry or apple), 4 glucose tablets, OR glucose  gel. Recheck blood sugar in 15 minutes after treatment (to make sure it is greater than 70 mg/dL). If your blood sugar is not greater than 70 mg/dL on recheck, call [phone] for further instructions. Report your blood sugar to the short stay nurse when you get to Short Stay.   The day of surgery:                    Do not wear jewelry, make up, or nail polish            Do not wear lotions, powders, perfumes, or deodorant.            Do not shave 48 hours prior to surgery.              Do not bring valuables to the hospital.            [facility] is not responsible for any belongings or valuables.  Do NOT Smoke (Tobacco/Vaping) 24 hours prior to your procedure If you use a CPAP at night, you may bring all equipment for your overnight stay.   Contacts, glasses, dentures or bridgework may not be worn into surgery.      For patients admitted to the hospital, discharge time will be determined by your treatment team.   Patients discharged the day of surgery will not be allowed to drive home, and someone needs to  stay with them for 24 hours.    Special instructions:   [place]- Preparing For Surgery  Before surgery, you can play an important role. Because skin is not sterile, your skin needs to be as free of germs as possible. You can reduce the number of germs on your skin by washing with CHG (chlorahexidine gluconate) Soap before surgery.  CHG is an antiseptic cleaner which kills germs and bonds with the skin to continue killing germs even after washing.    Oral Hygiene is also important to reduce your risk of infection.  Remember - BRUSH YOUR TEETH THE MORNING OF SURGERY WITH YOUR REGULAR TOOTHPASTE  Please do not use if you have an allergy to CHG or antibacterial soaps. If your skin becomes reddened/irritated stop using the CHG.  Do not shave (including legs and underarms) for at least 48 hours prior to first CHG shower. It is OK to shave your face.  Please follow these  instructions carefully.   Shower the NIGHT BEFORE SURGERY and the MORNING OF SURGERY with DIAL Soap.   Pat yourself dry with a CLEAN TOWEL.  Wear CLEAN PAJAMAS to bed the night before surgery  Place CLEAN SHEETS on your bed the night of your first shower and DO NOT SLEEP WITH PETS.   Day of Surgery: Please shower morning of surgery  Wear Clean/Comfortable clothing the morning of surgery Do not apply any deodorants/lotions.   Remember to brush your teeth WITH YOUR REGULAR TOOTHPASTE.   Questions were answered. Patient verbalized understanding of instructions.

## 2022-04-01 ENCOUNTER — Other Ambulatory Visit: Payer: Self-pay

## 2022-04-01 ENCOUNTER — Encounter (HOSPITAL_COMMUNITY): Payer: Self-pay | Admitting: Vascular Surgery

## 2022-04-01 ENCOUNTER — Ambulatory Visit (HOSPITAL_COMMUNITY)
Admission: RE | Admit: 2022-04-01 | Discharge: 2022-04-01 | Disposition: A | Discharge status: Home / Self Care | Payer: 59 | Attending: Vascular Surgery | Admitting: Vascular Surgery

## 2022-04-01 ENCOUNTER — Ambulatory Visit (HOSPITAL_BASED_OUTPATIENT_CLINIC_OR_DEPARTMENT_OTHER): Payer: 59 | Admitting: Physician Assistant

## 2022-04-01 ENCOUNTER — Ambulatory Visit (HOSPITAL_COMMUNITY): Payer: 59 | Admitting: Physician Assistant

## 2022-04-01 ENCOUNTER — Encounter (HOSPITAL_COMMUNITY)
Admission: RE | Disposition: A | Discharge status: Home / Self Care | Payer: Self-pay | Source: Home / Self Care | Attending: Vascular Surgery

## 2022-04-01 DIAGNOSIS — N185 Chronic kidney disease, stage 5: Secondary | ICD-10-CM

## 2022-04-01 DIAGNOSIS — Z794 Long term (current) use of insulin: Secondary | ICD-10-CM | POA: Insufficient documentation

## 2022-04-01 DIAGNOSIS — I120 Hypertensive chronic kidney disease with stage 5 chronic kidney disease or end stage renal disease: Secondary | ICD-10-CM | POA: Insufficient documentation

## 2022-04-01 DIAGNOSIS — N189 Chronic kidney disease, unspecified: Secondary | ICD-10-CM | POA: Diagnosis not present

## 2022-04-01 DIAGNOSIS — N186 End stage renal disease: Secondary | ICD-10-CM | POA: Insufficient documentation

## 2022-04-01 DIAGNOSIS — Z6841 Body Mass Index (BMI) 40.0 and over, adult: Secondary | ICD-10-CM | POA: Diagnosis not present

## 2022-04-01 DIAGNOSIS — I129 Hypertensive chronic kidney disease with stage 1 through stage 4 chronic kidney disease, or unspecified chronic kidney disease: Secondary | ICD-10-CM | POA: Diagnosis not present

## 2022-04-01 DIAGNOSIS — E1122 Type 2 diabetes mellitus with diabetic chronic kidney disease: Secondary | ICD-10-CM | POA: Insufficient documentation

## 2022-04-01 DIAGNOSIS — Z992 Dependence on renal dialysis: Secondary | ICD-10-CM | POA: Diagnosis not present

## 2022-04-01 DIAGNOSIS — Z7985 Long-term (current) use of injectable non-insulin antidiabetic drugs: Secondary | ICD-10-CM | POA: Diagnosis not present

## 2022-04-01 DIAGNOSIS — E669 Obesity, unspecified: Secondary | ICD-10-CM | POA: Insufficient documentation

## 2022-04-01 DIAGNOSIS — D631 Anemia in chronic kidney disease: Secondary | ICD-10-CM

## 2022-04-01 HISTORY — PX: AV FISTULA PLACEMENT: SHX1204

## 2022-04-01 HISTORY — DX: Heart failure, unspecified: I50.9

## 2022-04-01 HISTORY — DX: Cardiac murmur, unspecified: R01.1

## 2022-04-01 LAB — GLUCOSE, CAPILLARY
Glucose-Capillary: 79 mg/dL (ref 70–99)
Glucose-Capillary: 99 mg/dL (ref 70–99)

## 2022-04-01 LAB — POCT I-STAT, CHEM 8
BUN: 49 mg/dL — ABNORMAL HIGH (ref 6–20)
Calcium, Ion: 1.18 mmol/L (ref 1.15–1.40)
Chloride: 109 mmol/L (ref 98–111)
Creatinine, Ser: 8.2 mg/dL — ABNORMAL HIGH (ref 0.44–1.00)
Glucose, Bld: 78 mg/dL (ref 70–99)
HCT: 28 % — ABNORMAL LOW (ref 36.0–46.0)
Hemoglobin: 9.5 g/dL — ABNORMAL LOW (ref 12.0–15.0)
Potassium: 4 mmol/L (ref 3.5–5.1)
Sodium: 140 mmol/L (ref 135–145)
TCO2: 19 mmol/L — ABNORMAL LOW (ref 22–32)

## 2022-04-01 LAB — POCT PREGNANCY, URINE: Preg Test, Ur: NEGATIVE

## 2022-04-01 SURGERY — ARTERIOVENOUS (AV) FISTULA CREATION
Anesthesia: General | Site: Arm Upper | Laterality: Left

## 2022-04-01 MED ORDER — PHENYLEPHRINE HCL-NACL 20-0.9 MG/250ML-% IV SOLN
INTRAVENOUS | Status: DC | PRN
  Administered 2022-04-01: 30 ug/min via INTRAVENOUS

## 2022-04-01 MED ORDER — DEXAMETHASONE SODIUM PHOSPHATE 10 MG/ML IJ SOLN
INTRAMUSCULAR | Status: AC
  Filled 2022-04-01: qty 1

## 2022-04-01 MED ORDER — ACETAMINOPHEN 10 MG/ML IV SOLN: 1000.0000 mg | Freq: Once | INTRAVENOUS | Status: DC | PRN

## 2022-04-01 MED ORDER — FENTANYL CITRATE (PF) 100 MCG/2ML IJ SOLN: 25.0000 ug | INTRAMUSCULAR | Status: DC | PRN

## 2022-04-01 MED ORDER — HEPARIN 6000 UNIT IRRIGATION SOLUTION
Status: DC | PRN
  Administered 2022-04-01: 1

## 2022-04-01 MED ORDER — LIDOCAINE-EPINEPHRINE (PF) 1 %-1:200000 IJ SOLN
INTRAMUSCULAR | Status: AC
  Filled 2022-04-01: qty 30

## 2022-04-01 MED ORDER — PROPOFOL 10 MG/ML IV BOLUS
INTRAVENOUS | Status: AC
  Filled 2022-04-01: qty 20

## 2022-04-01 MED ORDER — NEOSTIGMINE METHYLSULFATE 3 MG/3ML IV SOSY
PREFILLED_SYRINGE | INTRAVENOUS | Status: DC | PRN
  Administered 2022-04-01: 3 mg via INTRAVENOUS

## 2022-04-01 MED ORDER — MIDAZOLAM HCL 2 MG/2ML IJ SOLN
INTRAMUSCULAR | Status: DC | PRN
  Administered 2022-04-01: 2 mg via INTRAVENOUS

## 2022-04-01 MED ORDER — 0.9 % SODIUM CHLORIDE (POUR BTL) OPTIME
TOPICAL | Status: DC | PRN
  Administered 2022-04-01: 1000 mL

## 2022-04-01 MED ORDER — OXYCODONE HCL 5 MG PO TABS: 5.0000 mg | ORAL_TABLET | Freq: Once | ORAL | Status: DC | PRN

## 2022-04-01 MED ORDER — CHLORHEXIDINE GLUCONATE 4 % EX LIQD: 60.0000 mL | Freq: Once | CUTANEOUS | Status: DC

## 2022-04-01 MED ORDER — SODIUM CHLORIDE 0.9 % IV SOLN: INTRAVENOUS | Status: DC

## 2022-04-01 MED ORDER — LIDOCAINE HCL (CARDIAC) PF 100 MG/5ML IV SOSY
PREFILLED_SYRINGE | INTRAVENOUS | Status: DC | PRN
  Administered 2022-04-01: 60 mg via INTRATRACHEAL

## 2022-04-01 MED ORDER — LIDOCAINE 2% (20 MG/ML) 5 ML SYRINGE
INTRAMUSCULAR | Status: AC
  Filled 2022-04-01: qty 5

## 2022-04-01 MED ORDER — OXYCODONE-ACETAMINOPHEN 5-325 MG PO TABS: 1.0000 | ORAL_TABLET | Freq: Four times a day (QID) | ORAL | 0 refills | Status: DC | PRN

## 2022-04-01 MED ORDER — ROCURONIUM BROMIDE 10 MG/ML (PF) SYRINGE
PREFILLED_SYRINGE | INTRAVENOUS | Status: AC
  Filled 2022-04-01: qty 10

## 2022-04-01 MED ORDER — GLYCOPYRROLATE 0.2 MG/ML IJ SOLN
INTRAMUSCULAR | Status: DC | PRN
  Administered 2022-04-01: .4 mg via INTRAVENOUS

## 2022-04-01 MED ORDER — ONDANSETRON HCL 4 MG/2ML IJ SOLN
INTRAMUSCULAR | Status: AC
  Filled 2022-04-01: qty 2

## 2022-04-01 MED ORDER — MIDAZOLAM HCL 2 MG/2ML IJ SOLN
INTRAMUSCULAR | Status: AC
  Filled 2022-04-01: qty 2

## 2022-04-01 MED ORDER — PROPOFOL 10 MG/ML IV BOLUS
INTRAVENOUS | Status: DC | PRN
  Administered 2022-04-01: 200 mg via INTRAVENOUS

## 2022-04-01 MED ORDER — SUCCINYLCHOLINE CHLORIDE 200 MG/10ML IV SOSY
PREFILLED_SYRINGE | INTRAVENOUS | Status: DC | PRN
  Administered 2022-04-01: 120 mg via INTRAVENOUS

## 2022-04-01 MED ORDER — CEFAZOLIN IN SODIUM CHLORIDE 3-0.9 GM/100ML-% IV SOLN
3.0000 g | INTRAVENOUS | Status: DC
  Filled 2022-04-01: qty 100

## 2022-04-01 MED ORDER — HEPARIN 6000 UNIT IRRIGATION SOLUTION
Status: AC
  Filled 2022-04-01: qty 500

## 2022-04-01 MED ORDER — DEXAMETHASONE SODIUM PHOSPHATE 10 MG/ML IJ SOLN
INTRAMUSCULAR | Status: DC | PRN
  Administered 2022-04-01: 4 mg via INTRAVENOUS

## 2022-04-01 MED ORDER — CHLORHEXIDINE GLUCONATE 0.12 % MT SOLN
15.0000 mL | Freq: Once | OROMUCOSAL | Status: AC
  Administered 2022-04-01: 15 mL via OROMUCOSAL
  Filled 2022-04-01: qty 15

## 2022-04-01 MED ORDER — ORAL CARE MOUTH RINSE: 15.0000 mL | Freq: Once | OROMUCOSAL | Status: AC

## 2022-04-01 MED ORDER — ONDANSETRON HCL 4 MG/2ML IJ SOLN
INTRAMUSCULAR | Status: DC | PRN
  Administered 2022-04-01: 4 mg via INTRAVENOUS

## 2022-04-01 MED ORDER — FENTANYL CITRATE (PF) 250 MCG/5ML IJ SOLN
INTRAMUSCULAR | Status: AC
  Filled 2022-04-01: qty 5

## 2022-04-01 MED ORDER — FENTANYL CITRATE (PF) 100 MCG/2ML IJ SOLN
INTRAMUSCULAR | Status: DC | PRN
  Administered 2022-04-01: 100 ug via INTRAVENOUS
  Administered 2022-04-01: 50 ug via INTRAVENOUS

## 2022-04-01 MED ORDER — ROCURONIUM BROMIDE 100 MG/10ML IV SOLN
INTRAVENOUS | Status: DC | PRN
  Administered 2022-04-01: 20 mg via INTRAVENOUS

## 2022-04-01 MED ORDER — PROMETHAZINE HCL 25 MG/ML IJ SOLN: 6.2500 mg | INTRAMUSCULAR | Status: DC | PRN

## 2022-04-01 MED ORDER — PHENYLEPHRINE 80 MCG/ML (10ML) SYRINGE FOR IV PUSH (FOR BLOOD PRESSURE SUPPORT)
PREFILLED_SYRINGE | INTRAVENOUS | Status: DC | PRN
  Administered 2022-04-01: 160 ug via INTRAVENOUS
  Administered 2022-04-01: 80 ug via INTRAVENOUS
  Administered 2022-04-01: 160 ug via INTRAVENOUS
  Administered 2022-04-01: 80 ug via INTRAVENOUS

## 2022-04-01 MED ORDER — OXYCODONE HCL 5 MG/5ML PO SOLN: 5.0000 mg | Freq: Once | ORAL | Status: DC | PRN

## 2022-04-01 SURGICAL SUPPLY — 32 items
ARMBAND PINK RESTRICT EXTREMIT (MISCELLANEOUS) ×1 IMPLANT
BAG COUNTER SPONGE SURGICOUNT (BAG) ×1 IMPLANT
CANISTER SUCT 3000ML PPV (MISCELLANEOUS) ×1 IMPLANT
CLIP LIGATING EXTRA MED SLVR (CLIP) ×1 IMPLANT
CLIP LIGATING EXTRA SM BLUE (MISCELLANEOUS) ×1 IMPLANT
CLIP TI MEDIUM 6 (CLIP) IMPLANT
CLIP TI WIDE RED SMALL 6 (CLIP) IMPLANT
COVER PROBE W GEL 5X96 (DRAPES) IMPLANT
DERMABOND ADVANCED .7 DNX12 (GAUZE/BANDAGES/DRESSINGS) ×1 IMPLANT
ELECT REM PT RETURN 9FT ADLT (ELECTROSURGICAL) ×1
ELECTRODE REM PT RTRN 9FT ADLT (ELECTROSURGICAL) ×1 IMPLANT
GLOVE BIO SURGEON STRL SZ7.5 (GLOVE) ×1 IMPLANT
GOWN STRL REUS W/ TWL LRG LVL3 (GOWN DISPOSABLE) ×2 IMPLANT
GOWN STRL REUS W/ TWL XL LVL3 (GOWN DISPOSABLE) ×1 IMPLANT
GOWN STRL REUS W/TWL LRG LVL3 (GOWN DISPOSABLE) ×2
GOWN STRL REUS W/TWL XL LVL3 (GOWN DISPOSABLE) ×1
INSERT FOGARTY SM (MISCELLANEOUS) IMPLANT
KIT BASIN OR (CUSTOM PROCEDURE TRAY) ×1 IMPLANT
KIT TURNOVER KIT B (KITS) ×1 IMPLANT
NS IRRIG 1000ML POUR BTL (IV SOLUTION) ×1 IMPLANT
PACK CV ACCESS (CUSTOM PROCEDURE TRAY) ×1 IMPLANT
PAD ARMBOARD 7.5X6 YLW CONV (MISCELLANEOUS) ×2 IMPLANT
POWDER SURGICEL 3.0 GRAM (HEMOSTASIS) IMPLANT
SLING ARM FOAM STRAP LRG (SOFTGOODS) IMPLANT
SLING ARM FOAM STRAP MED (SOFTGOODS) IMPLANT
SUT MNCRL AB 4-0 PS2 18 (SUTURE) ×1 IMPLANT
SUT PROLENE 6 0 BV (SUTURE) ×1 IMPLANT
SUT VIC AB 3-0 SH 27 (SUTURE) ×1
SUT VIC AB 3-0 SH 27X BRD (SUTURE) ×1 IMPLANT
TOWEL GREEN STERILE (TOWEL DISPOSABLE) ×1 IMPLANT
UNDERPAD 30X36 HEAVY ABSORB (UNDERPADS AND DIAPERS) ×1 IMPLANT
WATER STERILE IRR 1000ML POUR (IV SOLUTION) ×1 IMPLANT

## 2022-04-01 NOTE — Transfer of Care (Signed)
Immediate Anesthesia Transfer of Care Note  Patient: Lindsay Tucker  Procedure(s) Performed: LEFT ARM BRACHIOCEPHALIC ARTERIOVENOUS (AV) FISTULA CREATION (Left: Arm Upper)  Patient Location: PACU  Anesthesia Type:General  Level of Consciousness: drowsy  Airway & Oxygen Therapy: Patient Spontanous Breathing and Patient connected to face mask oxygen  Post-op Assessment: Report given to RN, Post -op Vital signs reviewed and stable, and Patient moving all extremities X 4  Post vital signs: Reviewed and stable  Last Vitals:  Vitals Value Taken Time  BP 120/63 04/01/22 1147  Temp    Pulse 68 04/01/22 1149  Resp 23 04/01/22 1149  SpO2 99 % 04/01/22 1149  Vitals shown include unvalidated device data.  Last Pain:  Vitals:   04/01/22 0901  TempSrc:   PainSc: 0-No pain      Patients Stated Pain Goal: 0 ([phone])  Complications: No notable events documented.

## 2022-04-01 NOTE — Op Note (Signed)
    Patient name: Lindsay Tucker MRN: [id number] DOB: 1986/01/07 Sex: female  04/01/2022 Pre-operative Diagnosis: CKD Post-operative diagnosis:  Same Surgeon:  [name] Assistants:  Ivin Booty, PA Procedure:   Left brachiocephalic fistula Anesthesia:  General Blood Loss:  Minimal Specimens:  none  Findings: 4 mm artery, 5 mm vein  Indications: This is a 37 year old female with chronic renal insufficiency who comes in today for dialysis access.  She is right-handed.  I discussed the risks and benefits of the procedure including the risk of steal and not maturity and the need for future interventions.  She wished to proceed.  Procedure:  The patient was identified in the holding area and taken to [place] [address]  The patient was then placed supine on the table. general anesthesia was administered.  The patient was prepped and draped in the usual sterile fashion.  A time out was called and antibiotics were administered.  A PA was necessary to expedite the procedure and assist with technical details.  She help with exposure by providing suction and retraction.  She help with the anastomosis by following the suture.  She help with wound closure.  Ultrasound was used to evaluate the cephalic vein in the upper arm.  This was an excellent caliber vein measuring 4-5 mm.  A transverse incision was then made just proximal to the antecubital crease.  I first dissected out the brachial artery which was a disease-free 4 mm artery.  I then identified the cephalic vein.  This was fully mobilized throughout the width of the incision.  Side branches were ligated between silk ties.  The vein was marked for orientation and then ligated distally.  It distended nicely with heparin saline.  Next the artery was occluded with vascular clamps and a #11 blade was used to make an arteriotomy.  The vein was then cut to the appropriate length and spatulated to fit the size the arteriotomy.  A running anastomosis  was created with 6-0 Prolene.  Prior to completion, the appropriate flushing maneuvers were performed and the anastomosis was completed.  Blood flow was reestablished to the fistula into the hand.  There was a palpable thrill within the fistula and an excellent radial Doppler signal.  The wound was then irrigated.  Hemostasis was achieved.  The incision was closed with 2 layers of Vicryl followed by Dermabond.  There were no immediate complications.   Disposition: To PACU stable.   Theotis Burrow, M.D., [facility] [place] Office: [phone] Pager:  [phone]

## 2022-04-01 NOTE — H&P (Signed)
HPI:   Lindsay Tucker is a 37 y.o. female with history of chronic kidney disease secondary to hypertension and diabetes.  She has never been on dialysis she does have a great aunt on dialysis no other family members.  She is right-hand dominant.  She does work third shift quality control.  No previous chest, breast or shoulder surgeries or injuries.       Past Medical History:  Diagnosis Date   CKD (chronic kidney disease)     Diabetes mellitus without complication ([place])     Hypertension      History reviewed. No pertinent family history. History reviewed. No pertinent surgical history.   Short Social History:  Social History         Tobacco Use   Smoking status: Never   Smokeless tobacco: Never  Substance Use Topics   Alcohol use: Yes      Comment: social      No Known Allergies         Current Outpatient Medications  Medication Sig Dispense Refill   amLODipine (NORVASC) 10 MG tablet Take 10 mg by mouth daily.       docusate sodium (COLACE) 100 MG capsule Take 100 mg by mouth 2 (two) times daily.       Dulaglutide (TRULICITY) 1.5 0000000 SOPN Inject 1.5 mg into the skin every Thursday.       furosemide (LASIX) 40 MG tablet Take 40 mg by mouth every Monday, Wednesday, and Friday.       hydrALAZINE (APRESOLINE) 100 MG tablet Take 100 mg by mouth 3 (three) times daily.       insulin NPH-regular Human (70-30) 100 UNIT/ML injection Inject 40 Units into the skin 2 (two) times daily with a meal.       isosorbide mononitrate (IMDUR) 30 MG 24 hr tablet Take 30 mg by mouth daily.       labetalol (NORMODYNE) 200 MG tablet Take 200 mg by mouth 2 (two) times daily.       losartan-hydrochlorothiazide (HYZAAR) 100-25 MG tablet Take 1 tablet by mouth daily.       spironolactone (ALDACTONE) 25 MG tablet Take 1 tablet (25 mg total) by mouth 2 (two) times daily. 60 tablet 0   cloNIDine (CATAPRES - DOSED IN MG/24 HR) 0.2 mg/24hr patch Place 0.2 mg onto the skin every Friday.  (Patient not taking: Reported on 11/03/2021)        No current facility-administered medications for this visit.      Review of Systems  Constitutional:  Constitutional negative. HENT: HENT negative.  Eyes: Eyes negative.  Respiratory: Respiratory negative.  Cardiovascular: Cardiovascular negative.  GI: Gastrointestinal negative.  Musculoskeletal: Musculoskeletal negative.  Skin: Skin negative.  Neurological: Neurological negative. Hematologic: Hematologic/lymphatic negative.  Psychiatric: Psychiatric negative.          Objective:    Vitals:   04/01/22 0854  BP: (!) 176/93  Pulse: 78  Resp: 20  Temp: 98.1 F (36.7 C)  SpO2: 100%      Physical Exam HENT:     Head: Normocephalic.     Nose: Nose normal.  Eyes:     Pupils: Pupils are equal, round, and reactive to light.  Cardiovascular:     Rate and Rhythm: Normal rate.  Pulmonary:     Effort: Pulmonary effort is normal.  Abdominal:     General: Abdomen is flat.     Palpations: Abdomen is soft.  Musculoskeletal:  General: Normal range of motion.     Cervical back: Neck supple.     Right lower leg: No edema.     Left lower leg: No edema.  Skin:    General: Skin is warm.     Capillary Refill: Capillary refill takes less than 2 seconds.  Neurological:     General: No focal deficit present.     Mental Status: She is alert.  Psychiatric:        Mood and Affect: Mood normal.        Behavior: Behavior normal.        Thought Content: Thought content normal.        Judgment: Judgment normal.        Data: Right Cephalic   Diameter (cm)Depth (cm)Findings   +-----------------+-------------+----------+---------+  Prox upper arm       0.41        0.32              +-----------------+-------------+----------+---------+  Mid upper arm        0.43        0.32   branching  +-----------------+-------------+----------+---------+  Dist upper arm       0.48        0.28               +-----------------+-------------+----------+---------+  Antecubital fossa    0.56        0.19   branching  +-----------------+-------------+----------+---------+  Prox forearm         0.41        0.32   branching  +-----------------+-------------+----------+---------+  Mid forearm          0.42        0.21   branching  +-----------------+-------------+----------+---------+  Dist forearm         0.33        0.21              +-----------------+-------------+----------+---------+   +-----------------+-------------+----------+-------------------------------  ----+  Right Basilic    Diameter (cm)Depth (cm)             Findings                  +-----------------+-------------+----------+-------------------------------  ----+  Prox upper arm       0.48        1.40                                         +-----------------+-------------+----------+-------------------------------  ----+  Mid upper arm        0.48        1.15                                         +-----------------+-------------+----------+-------------------------------  ----+  Dist upper arm       0.52        0.94                                         +-----------------+-------------+----------+-------------------------------  ----+  Antecubital fossa    0.47        0.43                branching                +-----------------+-------------+----------+-------------------------------  ----+  Prox forearm         0.33        0.19    branching and branch connects  to                                                        cephalic V                +-----------------+-------------+----------+-------------------------------  ----+  Mid forearm          0.30        0.16                                         +-----------------+-------------+----------+-------------------------------  ----+  Distal forearm       0.23        0.19                                          +-----------------+-------------+----------+-------------------------------  ----+   +-----------------+-------------+----------+---------+  Left Cephalic    Diameter (cm)Depth (cm)Findings   +-----------------+-------------+----------+---------+  Prox upper arm       0.42        0.53              +-----------------+-------------+----------+---------+  Mid upper arm        0.41        0.67   branching  +-----------------+-------------+----------+---------+  Dist upper arm       0.45        0.26              +-----------------+-------------+----------+---------+  Antecubital fossa    0.39        0.50   branching  +-----------------+-------------+----------+---------+  Prox forearm         0.37        0.27              +-----------------+-------------+----------+---------+  Mid forearm          0.38        0.29              +-----------------+-------------+----------+---------+  Dist forearm         0.33        0.32   branching  +-----------------+-------------+----------+---------+   +-----------------+-------------+----------+--------+  Left Basilic     Diameter (cm)Depth (cm)Findings  +-----------------+-------------+----------+--------+  Prox upper arm       0.50        1.42             +-----------------+-------------+----------+--------+  Mid upper arm        0.50        1.06             +-----------------+-------------+----------+--------+  Dist upper arm       0.45        0.40             +-----------------+-------------+----------+--------+  Antecubital fossa    0.31        0.16             +-----------------+-------------+----------+--------+  Prox forearm  0.30        0.30             +-----------------+-------------+----------+--------+  Mid forearm          0.23        0.18             +-----------------+-------------+----------+--------+  Distal forearm       0.18         0.16             +-----------------+-------------+----------+--------+       Assessment/Plan:    37 year old female with chronic kidney disease stage V never been on dialysis before.  Plan is for left arm fistula vs graft today in OR.  I discussed the risk benefits alternatives of proceeding with left arm cephalic or basilic vein fistula as well as the possible need for additional procedures and she demonstrates good understanding we will get her scheduled in the near future.         Waynetta Sandy MD [facility]

## 2022-04-01 NOTE — Interval H&P Note (Signed)
History and Physical Interval Note:  [id number] AM  Lindsay Tucker  has presented today for surgery, with the diagnosis of Chronic Kidney Disease.  The various methods of treatment have been discussed with the patient and family. After consideration of risks, benefits and other options for treatment, the patient has consented to  Procedure(s): LEFT ARM ARTERIOVENOUS (AV) FISTULA CREATION VERSUS GRAFT (Left) as a surgical intervention.  The patient's history has been reviewed, patient examined, no change in status, stable for surgery.  I have reviewed the patient's chart and labs.  Questions were answered to the patient's satisfaction.     [name]

## 2022-04-01 NOTE — Discharge Instructions (Signed)
[facility]  Discharge Instructions  AV Fistula or Graft Surgery for Dialysis Access  Please refer to the following instructions for your post-procedure care. Your surgeon or physician assistant will discuss any changes with you.  Activity  You may drive the day following your surgery, if you are comfortable and no longer taking prescription pain medication. Resume full activity as the soreness in your incision resolves.  Bathing/Showering  You may shower after you go home. Keep your incision dry for 48 hours. Do not soak in a bathtub, hot tub, or swim until the incision heals completely. You may not shower if you have a hemodialysis catheter.  Incision Care  Clean your incision with mild soap and water after 48 hours. Pat the area dry with a clean towel. You do not need a bandage unless otherwise instructed. Do not apply any ointments or creams to your incision. You may have skin glue on your incision. Do not peel it off. It will come off on its own in about one week. Your arm may swell a bit after surgery. To reduce swelling use pillows to elevate your arm so it is above your heart. Your doctor will tell you if you need to lightly wrap your arm with an ACE bandage.  Diet  Resume your normal diet. There are not special food restrictions following this procedure. In order to heal from your surgery, it is CRITICAL to get adequate nutrition. Your body requires vitamins, minerals, and protein. Vegetables are the best source of vitamins and minerals. Vegetables also provide the perfect balance of protein. Processed food has little nutritional value, so try to avoid this.  Medications  Resume taking all of your medications. If your incision is causing pain, you may take over-the counter pain relievers such as acetaminophen (Tylenol). If you were prescribed a stronger pain medication, please be aware these medications can cause nausea and constipation. Prevent  nausea by taking the medication with a snack or meal. Avoid constipation by drinking plenty of fluids and eating foods with high amount of fiber, such as fruits, vegetables, and grains.  Do not take Tylenol if you are taking prescription pain medications.  Follow up Your surgeon may want to see you in the office following your access surgery. If so, this will be arranged at the time of your surgery.  Please call us immediately for any of the following conditions:  Increased pain, redness, drainage (pus) from your incision site Fever of 101 degrees or higher Severe or worsening pain at your incision site Hand pain or numbness.  Reduce your risk of vascular disease:  Stop smoking. If you would like help, call [facility] at 1-800-QUIT-NOW 512-764-2852) or [place] at [place] your cholesterol Maintain a desired weight Control your diabetes Keep your blood pressure down  Dialysis  It will take several weeks to several months for your new dialysis access to be ready for use. Your surgeon will determine when it is okay to use it. Your nephrologist will continue to direct your dialysis. You can continue to use your Permcath until your new access is ready for use.   [id number] Lindsay Tucker [id number] [birth year]  Surgeon(s): Serafina Mitchell, MD  Procedure(s): LEFT ARM BRACHIOCEPHALIC ARTERIOVENOUS (AV) FISTULA CREATION   May stick graft immediately   May stick graft on designated area only:   X Do not stick left AV fistula for 12 weeks    If you have any questions, please call the  office at [phone].

## 2022-04-01 NOTE — Anesthesia Procedure Notes (Signed)
Procedure Name: Intubation Date/Time: 04/01/2022 10:38 AM  Performed by: Niel Hummer, CRNAPre-anesthesia Checklist: Patient identified, Emergency Drugs available, Suction available and Patient being monitored Patient Re-evaluated:Patient Re-evaluated prior to induction Oxygen Delivery Method: Circle system utilized Preoxygenation: Pre-oxygenation with 100% oxygen Induction Type: IV induction Ventilation: Mask ventilation without difficulty Laryngoscope Size: Mac and 4 Grade View: Grade II Tube type: Oral Tube size: 7.0 mm Number of attempts: 1 Airway Equipment and Method: Stylet Placement Confirmation: ETT inserted through vocal cords under direct vision, positive ETCO2 and breath sounds checked- equal and bilateral Secured at: 23 cm Tube secured with: Tape Dental Injury: Teeth and Oropharynx as per pre-operative assessment

## 2022-04-01 NOTE — Anesthesia Preprocedure Evaluation (Addendum)
Anesthesia Evaluation  Patient identified by MRN, date of birth, ID band Patient awake    Reviewed: Allergy & Precautions, NPO status , Patient's Chart, lab work & pertinent test results  Airway Mallampati: I  TM Distance: >3 FB Neck ROM: Full    Dental  (+) Chipped, Missing   Pulmonary neg pulmonary ROS, Patient abstained from smoking.   Pulmonary exam normal        Cardiovascular hypertension, Pt. on medications and Pt. on home beta blockers Normal cardiovascular exam     Neuro/Psych negative neurological ROS     GI/Hepatic negative GI ROS,,,(+)     substance abuse    Endo/Other  diabetes, Insulin Dependent  Morbid obesityPatient on GLP-1 Agonist!  Renal/GU CRFRenal disease     Musculoskeletal negative musculoskeletal ROS (+)    Abdominal  (+) + obese  Peds  Hematology  (+) Blood dyscrasia, anemia   Anesthesia Other Findings Chronic Kidney Disease  Reproductive/Obstetrics Hcg negative                             Anesthesia Physical Anesthesia Plan  ASA: 3  Anesthesia Plan: General   Post-op Pain Management:    Induction: Intravenous  PONV Risk Score and Plan: 3 and Ondansetron, Dexamethasone, Midazolam and Treatment may vary due to age or medical condition  Airway Management Planned: Oral ETT  Additional Equipment:   Intra-op Plan:   Post-operative Plan: Extubation in OR  Informed Consent: I have reviewed the patients History and Physical, chart, labs and discussed the procedure including the risks, benefits and alternatives for the proposed anesthesia with the patient or authorized representative who has indicated his/her understanding and acceptance.     Dental advisory given  Plan Discussed with: CRNA  Anesthesia Plan Comments:        Anesthesia Quick Evaluation

## 2022-04-02 NOTE — Anesthesia Postprocedure Evaluation (Signed)
Anesthesia Post Note  Patient: Lindsay Tucker  Procedure(s) Performed: LEFT ARM BRACHIOCEPHALIC ARTERIOVENOUS (AV) FISTULA CREATION (Left: Arm Upper)     Patient location during evaluation: PACU Anesthesia Type: General Level of consciousness: awake Pain management: pain level controlled Vital Signs Assessment: post-procedure vital signs reviewed and stable Respiratory status: spontaneous breathing, nonlabored ventilation and respiratory function stable Cardiovascular status: blood pressure returned to baseline and stable Postop Assessment: no apparent nausea or vomiting Anesthetic complications: no   No notable events documented.  Last Vitals:  Vitals:   04/01/22 1200 04/01/22 1215  BP: 130/65 129/74  Pulse: 69 76  Resp: (!) 29 18  Temp:  36.7 C  SpO2: 96% 100%    Last Pain:  Vitals:   04/01/22 0901  TempSrc:   PainSc: 0-No pain                 Quasim Doyon P Daire Okimoto

## 2022-04-03 ENCOUNTER — Encounter (HOSPITAL_COMMUNITY): Payer: Self-pay | Admitting: Surgery

## 2022-04-04 IMAGING — US US RENAL
1 series · 14 of 25 positions shown · non-contrast
Comparison: None.

CLINICAL DATA: Chronic renal disease

EXAM:
RENAL / URINARY TRACT ULTRASOUND COMPLETE

[Series 1: us renal · 0.26mm/px · 14 of 31 slices shown]
[im 1/31]
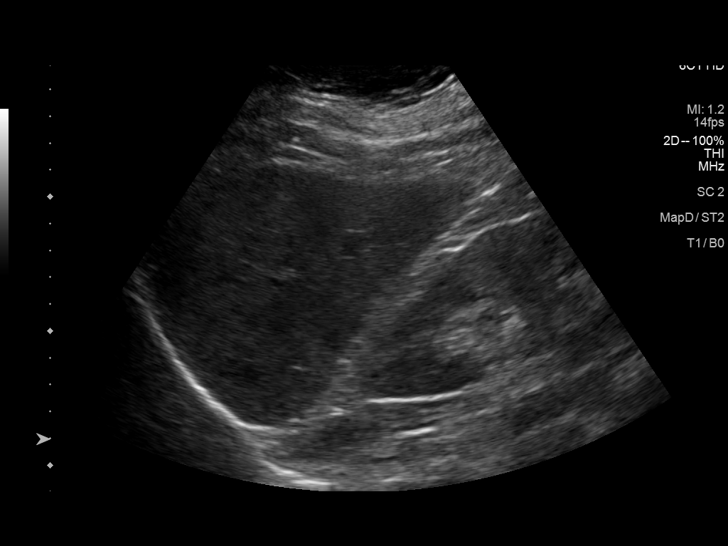
[im 3/31]
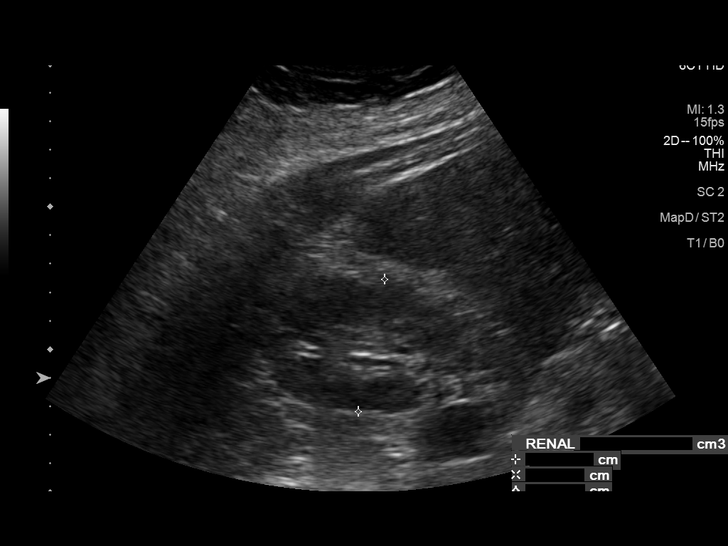
[im 6/31]
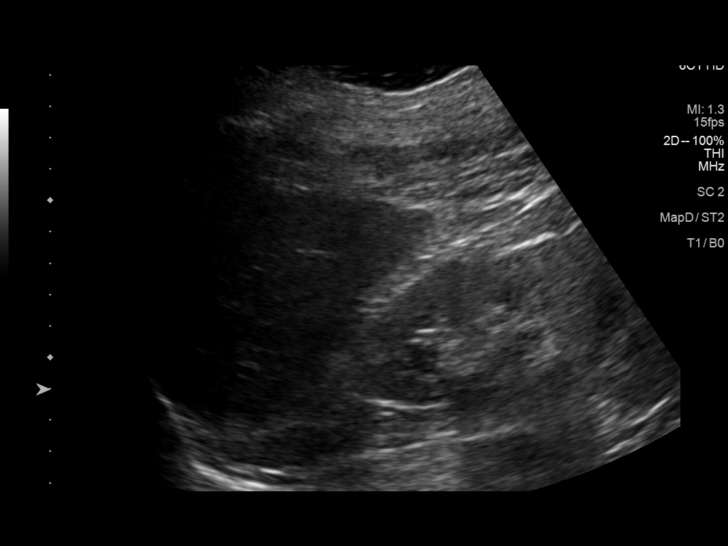
[im 8/31]
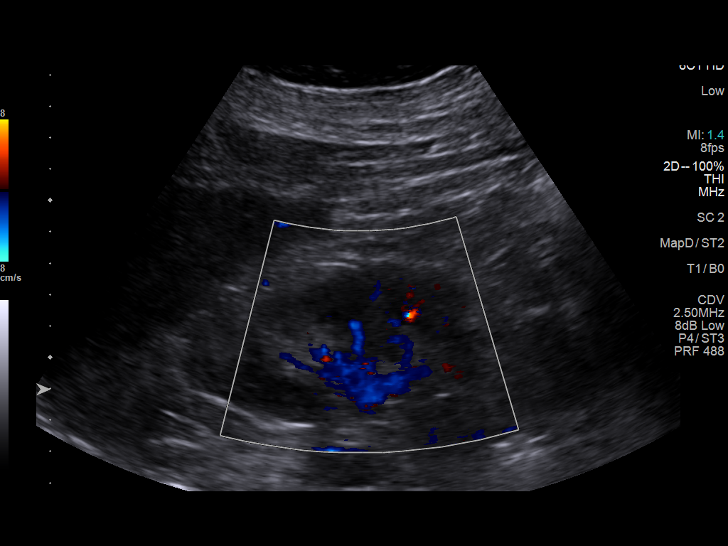
[im 11/31]
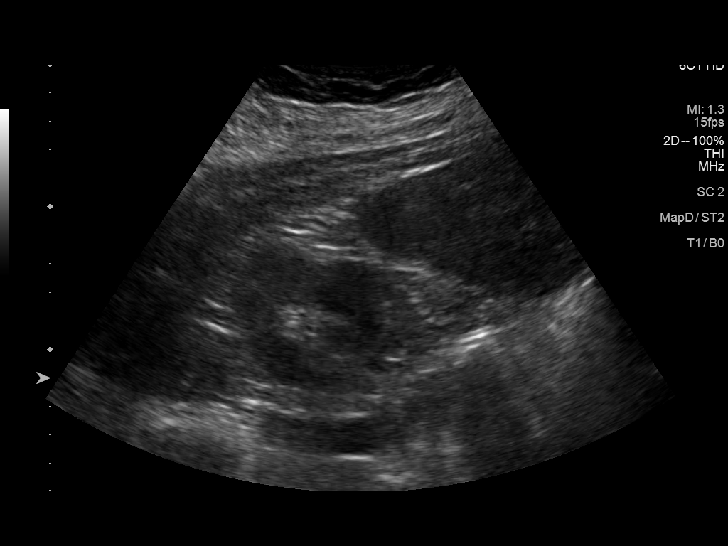
[im 12/31]
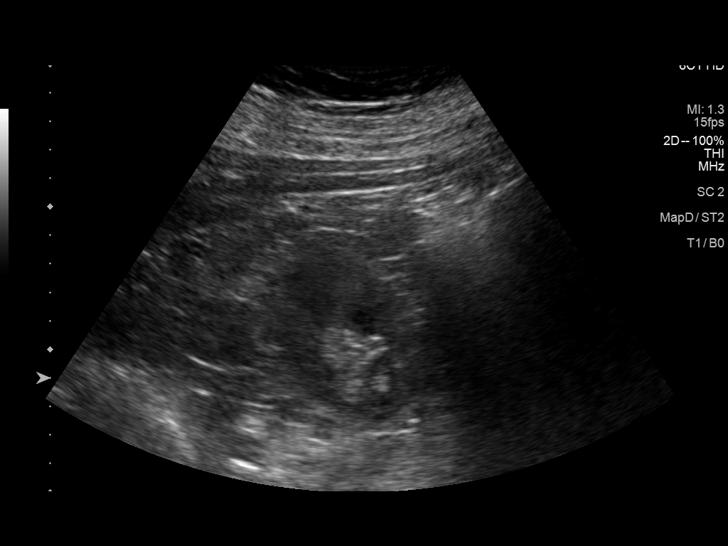
[im 14/31]
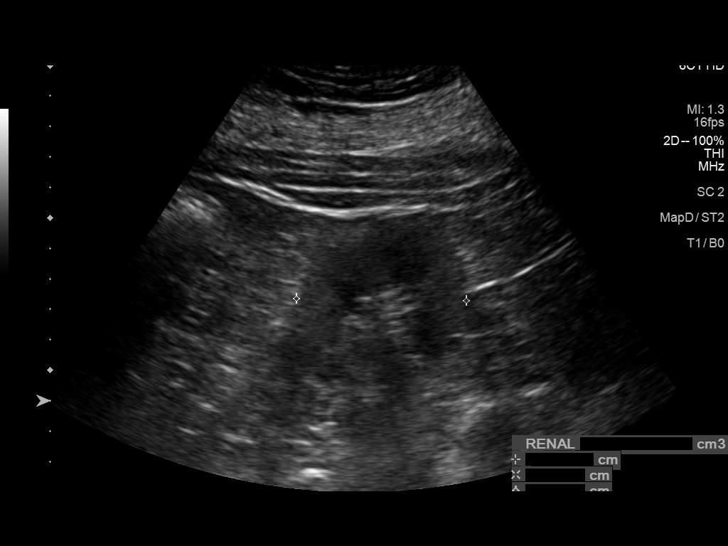
[im 17/31]
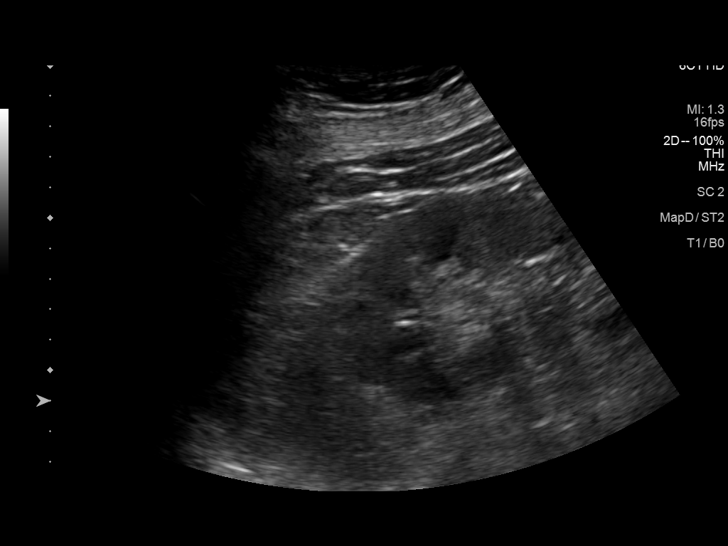
[im 19/31]
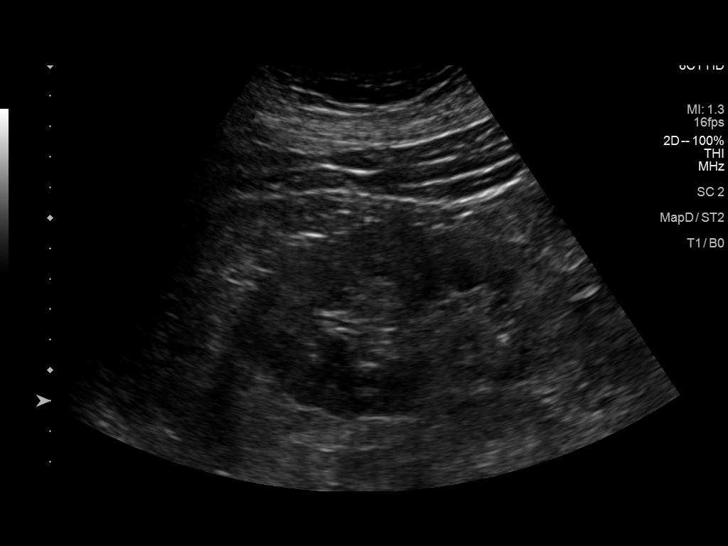
[im 21/31]
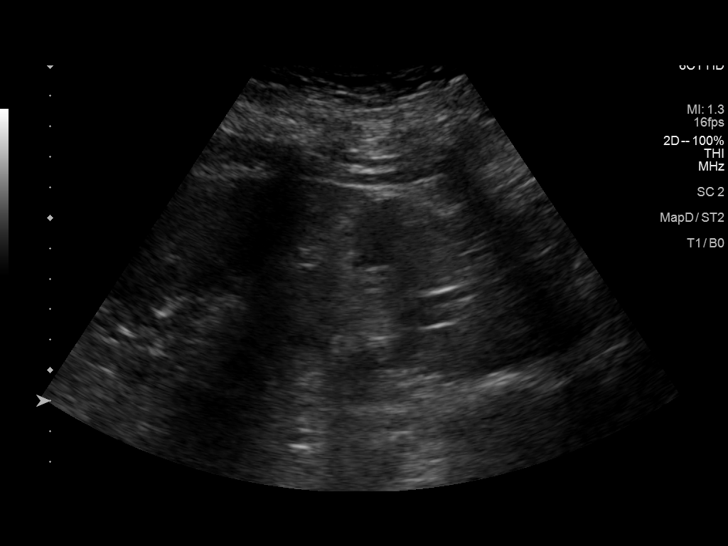
[im 23/31]
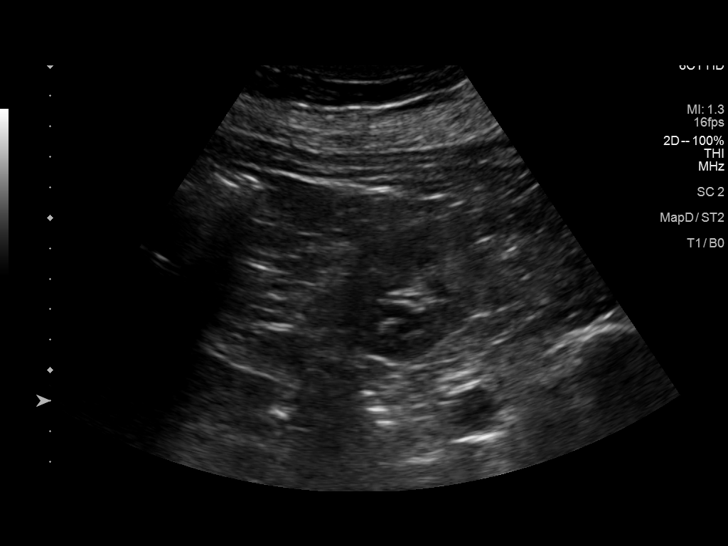
[im 26/31]
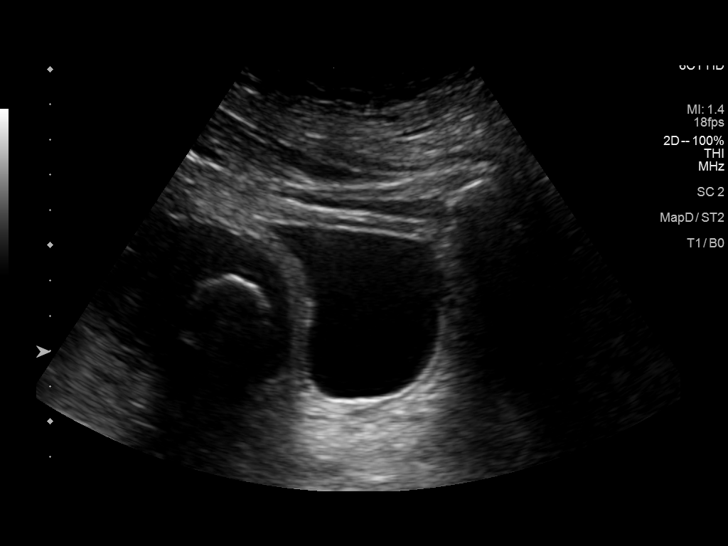
[im 28/31]
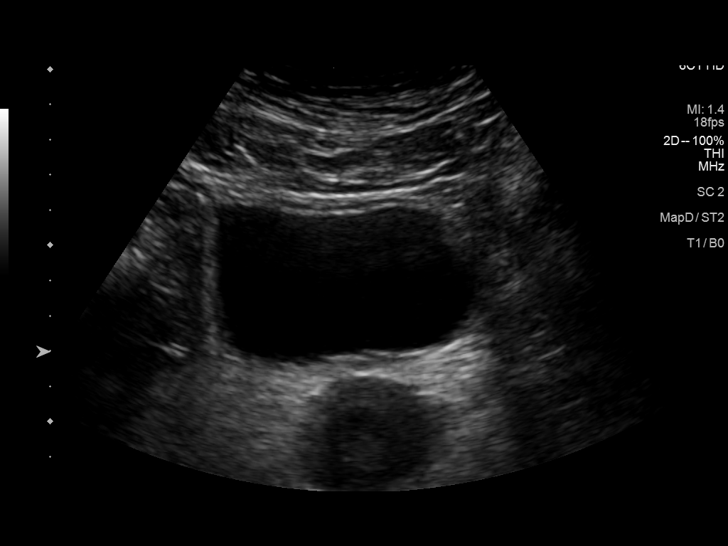
[im 31/31]
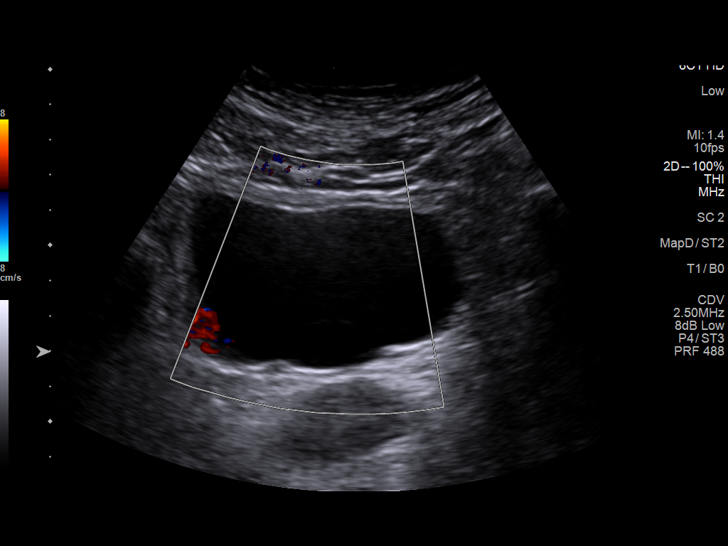

[14 of 25 positions shown; findings below may reference images not displayed]

FINDINGS: Right Kidney:

Renal measurements: 13.5 x 5.6 x 4.7 cm. = volume: 186 mL.
Echogenicity is diffusely increased consistent with medical renal
disease. No mass lesion or hydronephrosis is noted.

Left Kidney:

Renal measurements: 12.0 x 6.7 x 6.0 cm. = volume: 233 mL. Increased
echogenicity is noted. No mass or hydronephrosis is seen.

Bladder:

Appears normal for degree of bladder distention.

Other:

Calcified uterine fibroid is noted measuring at least 2.5 cm.
IMPRESSION: Findings consistent with medical renal disease. No obstructive
changes are noted.

Calcified uterine fibroid

## 2022-04-13 ENCOUNTER — Other Ambulatory Visit: Payer: Self-pay | Admitting: Internal Medicine

## 2022-04-14 LAB — COMPLETE METABOLIC PANEL WITH GFR
AG Ratio: 1.5 (calc) (ref 1.0–2.5)
ALT: 13 U/L (ref 6–29)
AST: 13 U/L (ref 10–30)
Albumin: 3.9 g/dL (ref 3.6–5.1)
Alkaline phosphatase (APISO): 47 U/L (ref 31–125)
BUN/Creatinine Ratio: 9 (calc) (ref 6–22)
BUN: 62 mg/dL — ABNORMAL HIGH (ref 7–25)
CO2: 16 mmol/L — ABNORMAL LOW (ref 20–32)
Calcium: 9.3 mg/dL (ref 8.6–10.2)
Chloride: 111 mmol/L — ABNORMAL HIGH (ref 98–110)
Creat: 6.91 mg/dL — ABNORMAL HIGH (ref 0.50–0.97)
Globulin: 2.6 g/dL (calc) (ref 1.9–3.7)
Glucose, Bld: 88 mg/dL (ref 65–99)
Potassium: 5 mmol/L (ref 3.5–5.3)
Sodium: 144 mmol/L (ref 135–146)
Total Bilirubin: 0.4 mg/dL (ref 0.2–1.2)
Total Protein: 6.5 g/dL (ref 6.1–8.1)
eGFR: 7 mL/min/{1.73_m2} — ABNORMAL LOW (ref >=60)

## 2022-04-14 LAB — CBC
HCT: 25.9 % — ABNORMAL LOW (ref 35.0–45.0)
Hemoglobin: 8.2 g/dL — ABNORMAL LOW (ref 11.7–15.5)
MCH: 26.3 pg — ABNORMAL LOW (ref 27.0–33.0)
MCHC: 31.7 g/dL — ABNORMAL LOW (ref 32.0–36.0)
MCV: 83 fL (ref 80.0–100.0)
MPV: 10 fL (ref 7.5–12.5)
Platelets: 339 10*3/uL (ref 140–400)
RBC: 3.12 10*6/uL — ABNORMAL LOW (ref 3.80–5.10)
RDW: 13.5 % (ref 11.0–15.0)
WBC: 7.3 10*3/uL (ref 3.8–10.8)

## 2022-04-14 LAB — LIPID PANEL
Cholesterol: 166 mg/dL (ref <200)
HDL: 43 mg/dL — ABNORMAL LOW (ref >=50)
LDL Cholesterol (Calc): 103 mg/dL (calc) — ABNORMAL HIGH
Non-HDL Cholesterol (Calc): 123 mg/dL (calc) (ref <130)
Total CHOL/HDL Ratio: 3.9 (calc) (ref <5.0)
Triglycerides: 102 mg/dL (ref <150)

## 2022-04-14 LAB — VITAMIN D 25 HYDROXY (VIT D DEFICIENCY, FRACTURES): Vit D, 25-Hydroxy: 19 ng/mL — ABNORMAL LOW (ref 30–100)

## 2022-04-14 LAB — TSH: TSH: 2.67 mIU/L

## 2022-04-19 ENCOUNTER — Other Ambulatory Visit: Payer: Self-pay

## 2022-04-19 DIAGNOSIS — Z992 Dependence on renal dialysis: Secondary | ICD-10-CM

## 2022-05-02 ENCOUNTER — Inpatient Hospital Stay (HOSPITAL_COMMUNITY): Admission: RE | Admit: 2022-05-02 | Payer: 59 | Source: Ambulatory Visit

## 2022-05-05 ENCOUNTER — Ambulatory Visit (HOSPITAL_COMMUNITY)
Admission: RE | Admit: 2022-05-05 | Discharge: 2022-05-05 | Disposition: A | Discharge status: Home / Self Care | Payer: 59 | Source: Ambulatory Visit | Attending: Nephrology | Admitting: Nephrology

## 2022-05-05 VITALS — BP 124/84 | HR 86 | Temp 97.3°F | Resp 18

## 2022-05-05 DIAGNOSIS — N189 Chronic kidney disease, unspecified: Secondary | ICD-10-CM | POA: Diagnosis present

## 2022-05-05 LAB — POCT HEMOGLOBIN-HEMACUE: Hemoglobin: 8.3 g/dL — ABNORMAL LOW (ref 12.0–15.0)

## 2022-05-05 MED ORDER — EPOETIN ALFA-EPBX 10000 UNIT/ML IJ SOLN: 10000.0000 [IU] | INTRAMUSCULAR | Status: DC

## 2022-05-05 MED ORDER — EPOETIN ALFA-EPBX 10000 UNIT/ML IJ SOLN
INTRAMUSCULAR | Status: AC
  Administered 2022-05-05: 10000 [IU] via SUBCUTANEOUS
  Filled 2022-05-05: qty 1

## 2022-05-09 ENCOUNTER — Ambulatory Visit (HOSPITAL_COMMUNITY): Payer: 59

## 2022-05-09 NOTE — Progress Notes (Deleted)
  POST OPERATIVE OFFICE NOTE    CC:  F/u for surgery  HPI:  This is a 37 y.o. female who is s/p left BC AVF on 04/01/2022 by Dr. Trula Slade.   Pt states she does *** have pain/numbness in the *** hand.    The pt *** on dialysis *** at *** location.   No Known Allergies  Current Outpatient Medications  Medication Sig Dispense Refill   amLODipine (NORVASC) 10 MG tablet Take 10 mg by mouth in the morning.     calcitRIOL (ROCALTROL) 0.25 MCG capsule Take 0.25 mcg by mouth every Monday, Wednesday, and Friday.     Dulaglutide (TRULICITY) 1.5 0000000 SOPN Inject 1.5 mg into the skin every Monday.     furosemide (LASIX) 80 MG tablet Take 80 mg by mouth in the morning.     hydrALAZINE (APRESOLINE) 100 MG tablet Take 100 mg by mouth 3 (three) times daily.     insulin NPH-regular Human (70-30) 100 UNIT/ML injection Inject 40 Units into the skin 2 (two) times daily with a meal.     isosorbide mononitrate (IMDUR) 30 MG 24 hr tablet Take 30 mg by mouth daily.     labetalol (NORMODYNE) 200 MG tablet Take 200 mg by mouth 2 (two) times daily.     losartan (COZAAR) 100 MG tablet Take 100 mg by mouth in the morning.     oxyCODONE-acetaminophen (PERCOCET) 5-325 MG tablet Take 1 tablet by mouth every 6 (six) hours as needed for severe pain. 8 tablet 0   No current facility-administered medications for this visit.     ROS:  See HPI  Physical Exam:  ***  Incision:  *** Extremities:   There *** a palpable *** pulse.   Motor and sensory *** in tact.   There *** a thrill/bruit present.  Access is *** easily palpable   Dialysis Duplex on 05/09/2022: ***   Assessment/Plan:  This is a 37 y.o. female who is s/p: Left BC AVF 04/01/2022 by Dr. Trula Slade  -the pt does *** have evidence of steal. -pt's access can be used ***. -if pt has tunneled catheter, this can be removed at the discretion of the dialysis center once the pt's access has been successfully cannulated to their satisfaction.  -discussed  with pt that access does not last forever and will need intervention or even new access at some point.  -the pt will follow up ***   Leontine Locket, [facility] [phone]  Clinic MD:  Trula Slade

## 2022-05-12 ENCOUNTER — Encounter: Payer: 59 | Admitting: Nurse Practitioner

## 2022-05-12 DIAGNOSIS — Z0289 Encounter for other administrative examinations: Secondary | ICD-10-CM

## 2022-05-16 ENCOUNTER — Ambulatory Visit (INDEPENDENT_AMBULATORY_CARE_PROVIDER_SITE_OTHER): Payer: 59 | Admitting: Physician Assistant

## 2022-05-16 ENCOUNTER — Encounter (HOSPITAL_COMMUNITY): Payer: 59

## 2022-05-16 ENCOUNTER — Encounter (HOSPITAL_COMMUNITY): Payer: Self-pay | Admitting: Nephrology

## 2022-05-16 ENCOUNTER — Ambulatory Visit (HOSPITAL_COMMUNITY)
Admission: RE | Admit: 2022-05-16 | Discharge: 2022-05-16 | Disposition: A | Discharge status: Home / Self Care | Payer: 59 | Source: Ambulatory Visit | Attending: Surgery | Admitting: Surgery

## 2022-05-16 VITALS — BP 178/92 | HR 83 | Temp 97.6°F

## 2022-05-16 DIAGNOSIS — N186 End stage renal disease: Secondary | ICD-10-CM

## 2022-05-16 DIAGNOSIS — Z992 Dependence on renal dialysis: Secondary | ICD-10-CM | POA: Diagnosis not present

## 2022-05-16 DIAGNOSIS — N185 Chronic kidney disease, stage 5: Secondary | ICD-10-CM

## 2022-05-16 NOTE — Progress Notes (Signed)
POST OPERATIVE OFFICE NOTE    CC:  F/u for surgery  HPI:  This is a 37 y.o. female who is s/p left brachiocephalic fistula creation on 04/01/2022 by Dr. Trula Slade.  This was done for future dialysis access since the patient has a history of CKD 5.  Pt returns today for follow up.  Pt states she has been doing well since surgery.  She denies any issues with her incision.  She still is unsure about when she will need to start dialysis.  She denies any excessive coldness, pain, numbness, or difficulty with grip strength in the left hand.  She endorses occasional tingling in the left hand but this is not very bothersome to her.   No Known Allergies  Current Outpatient Medications  Medication Sig Dispense Refill   amLODipine (NORVASC) 10 MG tablet Take 10 mg by mouth in the morning.     calcitRIOL (ROCALTROL) 0.25 MCG capsule Take 0.25 mcg by mouth every Monday, Wednesday, and Friday.     Dulaglutide (TRULICITY) 1.5 0000000 SOPN Inject 1.5 mg into the skin every Monday.     furosemide (LASIX) 80 MG tablet Take 80 mg by mouth in the morning.     hydrALAZINE (APRESOLINE) 100 MG tablet Take 100 mg by mouth 3 (three) times daily.     insulin NPH-regular Human (70-30) 100 UNIT/ML injection Inject 40 Units into the skin 2 (two) times daily with a meal.     isosorbide mononitrate (IMDUR) 30 MG 24 hr tablet Take 30 mg by mouth daily.     labetalol (NORMODYNE) 200 MG tablet Take 200 mg by mouth 2 (two) times daily.     losartan (COZAAR) 100 MG tablet Take 100 mg by mouth in the morning.     No current facility-administered medications for this visit.     ROS:  See HPI  Physical Exam:  Incision: Left AC fossa incision well-healed Extremities: Left brachiocephalic fistula with great thrill on palpation, not easy to visualize due to depth.  Great bruit on auscultation throughout the upper arm.  Palpable 2+ left radial pulse Neuro: Intact motor and sensation of left upper extremity   Studies:  (05/16/2022)  +--------------------+----------+-----------------+--------+  AVF                PSV (cm/s)Flow Vol (mL/min)Comments  +--------------------+----------+-----------------+--------+  Native artery inflow   327          1826                 +--------------------+----------+-----------------+--------+  AVF Anastomosis        411                               +--------------------+----------+-----------------+--------+     +------------+---------+------------+----------+---------------------------  ----+  OUTFLOW VEIN   PSV     Diameter  Depth (cm)           Describe                            (cm/s)      (cm)                                                +------------+---------+------------+----------+---------------------------  ----+  Shoulder      211  0.66       1.21     competing branch  measuring                                                            0.33cm                +------------+---------+------------+----------+---------------------------  ----+  Prox UA        315       0.48       0.92     competing branch  measuring                                                            0.27cm                +------------+---------+------------+----------+---------------------------  ----+  Mid UA         279       0.47       0.75     competing branch  measuring                                                            0.24cm                +------------+---------+------------+----------+---------------------------  ----+  Dist UA        246       0.51       0.75                                     +------------+---------+------------+----------+---------------------------  ----+  AC Fossa       651       0.67       0.42                                     +------------+---------+------------+----------+---------------------------  ----+    Assessment/Plan:  This is a 37 y.o.  female who is s/p: Creation of left brachiocephalic fistula on [id number]   -Her left arm incision is well healed without signs of infection.  -She denies any excessive coldness, pain, or weakness in the left hand. She endorses occasional numbness of the left hand but this is tolerable. She has a palpable radial pulse -Duplex demonstrates a patent brachiocephalic fistula that is not fully matured in size. There is great flow volume of 1826 ml/min. The fistula is also fairly deep in most of the arm, greater than 10mm below the surface of the skin with at least 3 competing branches. I have explained to the patient that she would likely need superficialization with branch ligation to help the fistula fully mature. -There is currently not a rush for surgery since the patient does not need dialysis yet, but we will try schedule her  for left brachiocephalic fistula superficialization and branch ligation with Dr.Brabham in 5-6 wks. This could be done sooner if need be   [name], PA-C [facility] [phone]   Call MD: Carlis Abbott

## 2022-05-16 NOTE — H&P (View-Only) (Signed)
POST OPERATIVE OFFICE NOTE    CC:  F/u for surgery  HPI:  This is a 37 y.o. female who is s/p left brachiocephalic fistula creation on 04/01/2022 by Dr. Brabham.  This was done for future dialysis access since the patient has a history of CKD 5.  Pt returns today for follow up.  Pt states she has been doing well since surgery.  She denies any issues with her incision.  She still is unsure about when she will need to start dialysis.  She denies any excessive coldness, pain, numbness, or difficulty with grip strength in the left hand.  She endorses occasional tingling in the left hand but this is not very bothersome to her.   No Known Allergies  Current Outpatient Medications  Medication Sig Dispense Refill   amLODipine (NORVASC) 10 MG tablet Take 10 mg by mouth in the morning.     calcitRIOL (ROCALTROL) 0.25 MCG capsule Take 0.25 mcg by mouth every Monday, Wednesday, and Friday.     Dulaglutide (TRULICITY) 1.5 MG/0.5ML SOPN Inject 1.5 mg into the skin every Monday.     furosemide (LASIX) 80 MG tablet Take 80 mg by mouth in the morning.     hydrALAZINE (APRESOLINE) 100 MG tablet Take 100 mg by mouth 3 (three) times daily.     insulin NPH-regular Human (70-30) 100 UNIT/ML injection Inject 40 Units into the skin 2 (two) times daily with a meal.     isosorbide mononitrate (IMDUR) 30 MG 24 hr tablet Take 30 mg by mouth daily.     labetalol (NORMODYNE) 200 MG tablet Take 200 mg by mouth 2 (two) times daily.     losartan (COZAAR) 100 MG tablet Take 100 mg by mouth in the morning.     No current facility-administered medications for this visit.     ROS:  See HPI  Physical Exam:  Incision: Left AC fossa incision well-healed Extremities: Left brachiocephalic fistula with great thrill on palpation, not easy to visualize due to depth.  Great bruit on auscultation throughout the upper arm.  Palpable 2+ left radial pulse Neuro: Intact motor and sensation of left upper extremity   Studies:  (05/16/2022)  +--------------------+----------+-----------------+--------+  AVF                PSV (cm/s)Flow Vol (mL/min)Comments  +--------------------+----------+-----------------+--------+  Native artery inflow   327          1826                 +--------------------+----------+-----------------+--------+  AVF Anastomosis        411                               +--------------------+----------+-----------------+--------+     +------------+---------+------------+----------+---------------------------  ----+  OUTFLOW VEIN   PSV     Diameter  Depth (cm)           Describe                            (cm/s)      (cm)                                                +------------+---------+------------+----------+---------------------------  ----+  Shoulder      211         0.66       1.21     competing branch  measuring                                                            0.33cm                +------------+---------+------------+----------+---------------------------  ----+  Prox UA        315       0.48       0.92     competing branch  measuring                                                            0.27cm                +------------+---------+------------+----------+---------------------------  ----+  Mid UA         279       0.47       0.75     competing branch  measuring                                                            0.24cm                +------------+---------+------------+----------+---------------------------  ----+  Dist UA        246       0.51       0.75                                     +------------+---------+------------+----------+---------------------------  ----+  AC Fossa       651       0.67       0.42                                     +------------+---------+------------+----------+---------------------------  ----+    Assessment/Plan:  This is a 37 y.o.  female who is s/p: Creation of left brachiocephalic fistula on 04/01/2022   -Her left arm incision is well healed without signs of infection.  -She denies any excessive coldness, pain, or weakness in the left hand. She endorses occasional numbness of the left hand but this is tolerable. She has a palpable radial pulse -Duplex demonstrates a patent brachiocephalic fistula that is not fully matured in size. There is great flow volume of 1826 ml/min. The fistula is also fairly deep in most of the arm, greater than 6mm below the surface of the skin with at least 3 competing branches. I have explained to the patient that she would likely need superficialization with branch ligation to help the fistula fully mature. -There is currently not a rush for surgery since the patient does not need dialysis yet, but we will try schedule her   for left brachiocephalic fistula superficialization and branch ligation with Dr.Brabham in 5-6 wks. This could be done sooner if need be   Nathalee Smarr, PA-C [facility] [phone]   Call MD: Clark 

## 2022-05-18 ENCOUNTER — Telehealth: Payer: Self-pay | Admitting: *Deleted

## 2022-05-18 NOTE — Telephone Encounter (Signed)
Attempted to reach pt in regard to scheduling procedure. Left Voice mail   Phoebe Sharps, RN

## 2022-05-19 ENCOUNTER — Encounter (HOSPITAL_COMMUNITY)
Admission: RE | Admit: 2022-05-19 | Discharge: 2022-05-19 | Disposition: A | Discharge status: Home / Self Care | Payer: 59 | Source: Ambulatory Visit | Attending: Nephrology | Admitting: Nephrology

## 2022-05-19 VITALS — BP 178/70 | HR 87 | Temp 97.3°F | Resp 18

## 2022-05-19 DIAGNOSIS — N189 Chronic kidney disease, unspecified: Secondary | ICD-10-CM | POA: Diagnosis present

## 2022-05-19 MED ORDER — CLONIDINE HCL 0.1 MG PO TABS
0.1000 mg | ORAL_TABLET | Freq: Once | ORAL | Status: AC | PRN
  Administered 2022-05-19: 0.1 mg via ORAL

## 2022-05-19 MED ORDER — CLONIDINE HCL 0.1 MG PO TABS
ORAL_TABLET | ORAL | Status: AC
  Filled 2022-05-19: qty 1

## 2022-05-19 MED ORDER — EPOETIN ALFA-EPBX 10000 UNIT/ML IJ SOLN
10000.0000 [IU] | INTRAMUSCULAR | Status: DC
  Administered 2022-05-19: 10000 [IU] via SUBCUTANEOUS

## 2022-05-19 MED ORDER — EPOETIN ALFA-EPBX 10000 UNIT/ML IJ SOLN
INTRAMUSCULAR | Status: AC
  Filled 2022-05-19: qty 1

## 2022-05-20 ENCOUNTER — Other Ambulatory Visit: Payer: Self-pay

## 2022-05-20 DIAGNOSIS — N185 Chronic kidney disease, stage 5: Secondary | ICD-10-CM

## 2022-05-20 LAB — POCT HEMOGLOBIN-HEMACUE: Hemoglobin: 9 g/dL — ABNORMAL LOW (ref 12.0–15.0)

## 2022-05-20 NOTE — Telephone Encounter (Signed)
Called pt to schedule surgery, no answer, lf vm. 

## 2022-05-20 NOTE — Telephone Encounter (Signed)
Patient returned call and scheduled surgery for May 1. Instructions provided and patient verbalized understanding.

## 2022-05-25 ENCOUNTER — Encounter: Payer: Self-pay | Admitting: Nurse Practitioner

## 2022-05-25 ENCOUNTER — Ambulatory Visit (INDEPENDENT_AMBULATORY_CARE_PROVIDER_SITE_OTHER): Payer: 59 | Admitting: Nurse Practitioner

## 2022-05-25 VITALS — BP 197/103 | HR 86 | Temp 97.7°F | Ht 66.0 in | Wt 273.0 lb

## 2022-05-25 DIAGNOSIS — I10 Essential (primary) hypertension: Secondary | ICD-10-CM | POA: Diagnosis not present

## 2022-05-25 DIAGNOSIS — Z6841 Body Mass Index (BMI) 40.0 and over, adult: Secondary | ICD-10-CM

## 2022-05-25 NOTE — Progress Notes (Signed)
Office: [phone]  /  Fax: [phone]   Initial Visit  Lindsay Tucker was seen in clinic today to evaluate for obesity. She is interested in losing weight to improve overall health and reduce the risk of weight related complications. She presents today to review program treatment options, initial physical assessment, and evaluation.     She was referred by: Specialist  When asked what else they would like to accomplish? She states: Improve existing medical conditions, Reduce number of medications, Improve quality of life, and Lose a target amount of weight : Goal weight:  199 lbs  Weight history:  She has struggled with her weight since she was a child.    When asked how has your weight affected you? She states: Contributed to medical problems, Having fatigue, and Having poor endurance  Some associated conditions:  CHF, allergies, DMT2, CKD, iron def anemia  Contributing factors: Family history, Nutritional, Medications, and Life event  Weight promoting medications identified: None  Current nutrition plan: None  Current level of physical activity: None  Current or previous pharmacotherapy: None  Response to medication: Never tried medications   Past medical history includes:   Past Medical History:  Diagnosis Date   Anemia    CHF (congestive heart failure)    CKD (chronic kidney disease)    Diabetes mellitus without complication    Heart murmur    History of blood transfusion    Hypertension      Objective:   BP (!) 197/103   Pulse 86   Temp 97.7 F (36.5 C)   Ht 5\' 6"  (1.676 m)   Wt 273 lb (123.8 kg)   LMP 05/09/2022 (Approximate)   SpO2 100%   BMI 44.06 kg/m  She was weighed on the bioimpedance scale: Body mass index is 44.06 kg/m.  Peak Weight: 330 lbs , Body Fat%:50, Visceral Fat Rating:14, Weight trend over the last 12 months: Increasing  General:  Alert, oriented and cooperative. Patient is in no acute distress.  Respiratory: Normal  respiratory effort, no problems with respiration noted   Gait: able to ambulate independently  Mental Status: Normal mood and affect. Normal behavior. Normal judgment and thought content.   DIAGNOSTIC DATA REVIEWED:  BMET    Component Value Date/Time   NA 144 04/13/2022 1540   K 5.0 04/13/2022 1540   CL 111 (H) 04/13/2022 1540   CO2 16 (L) 04/13/2022 1540   GLUCOSE 88 04/13/2022 1540   BUN 62 (H) 04/13/2022 1540   CREATININE 6.91 (H) 04/13/2022 1540   CALCIUM 9.3 04/13/2022 1540   GFRNONAA >60 05/07/2015 1149   GFRAA >60 05/07/2015 1149   No results found for: "HGBA1C" No results found for: "INSULIN" CBC    Component Value Date/Time   WBC 7.3 04/13/2022 1540   RBC 3.12 (L) 04/13/2022 1540   HGB 9.0 (L) 05/19/2022 1352   HCT 25.9 (L) 04/13/2022 1540   PLT 339 04/13/2022 1540   MCV 83.0 04/13/2022 1540   MCH 26.3 (L) 04/13/2022 1540   MCHC 31.7 (L) 04/13/2022 1540   RDW 13.5 04/13/2022 1540   Iron/TIBC/Ferritin/ %Sat No results found for: "IRON", "TIBC", "FERRITIN", "IRONPCTSAT" Lipid Panel     Component Value Date/Time   CHOL 166 04/13/2022 1540   TRIG 102 04/13/2022 1540   HDL 43 (L) 04/13/2022 1540   CHOLHDL 3.9 04/13/2022 1540   LDLCALC 103 (H) 04/13/2022 1540   Hepatic Function Panel     Component Value Date/Time   PROT 6.5 04/13/2022 1540  ALBUMIN 3.9 05/07/2015 1149   AST 13 04/13/2022 1540   ALT 13 04/13/2022 1540   ALKPHOS 59 05/07/2015 1149   BILITOT 0.4 04/13/2022 1540      Component Value Date/Time   TSH 2.67 04/13/2022 1540     Assessment and Plan:   Hypertension, unspecified type Continue to follow-up with PCP.  Continue medications as directed.  Morbid obesity  BMI 40.0-44.9, adult        Obesity Treatment / Action Plan:  Patient will work on garnering support from family and friends to begin weight loss journey. Will work on eliminating or reducing the presence of highly palatable, calorie dense foods in the home. Will  complete provided nutritional and psychosocial assessment questionnaire before the next appointment. Will be scheduled for indirect calorimetry to determine resting energy expenditure in a fasting state.  This will allow [place] to create a reduced calorie, high-protein meal plan to promote loss of fat mass while preserving muscle mass.  Obesity Education Performed Today:  She was weighed on the bioimpedance scale and results were discussed and documented in the synopsis.  We discussed obesity as a disease and the importance of a more detailed evaluation of all the factors contributing to the disease.  We discussed the importance of long term lifestyle changes which include nutrition, exercise and behavioral modifications as well as the importance of customizing this to her specific health and social needs.  We discussed the benefits of reaching a healthier weight to alleviate the symptoms of existing conditions and reduce the risks of the biomechanical, metabolic and psychological effects of obesity.  Lindsay Tucker appears to be in the action stage of change and states they are ready to start intensive lifestyle modifications and behavioral modifications.  30 minutes was spent today on this visit including the above counseling, pre-visit chart review, and post-visit documentation.  Reviewed by clinician on day of visit: allergies, medications, problem list, medical history, surgical history, family history, social history, and previous encounter notes pertinent to obesity diagnosis.    Lindsay Sato Ivis Nicolson FNP-C

## 2022-05-31 ENCOUNTER — Telehealth: Payer: Self-pay

## 2022-05-31 NOTE — Telephone Encounter (Signed)
Contacted patient to update on arrival time for surgery is now 0730 AM on 06/15/22.

## 2022-06-02 ENCOUNTER — Encounter (HOSPITAL_COMMUNITY)
Admission: RE | Admit: 2022-06-02 | Discharge: 2022-06-02 | Disposition: A | Discharge status: Home / Self Care | Payer: 59 | Source: Ambulatory Visit | Attending: Nephrology | Admitting: Nephrology

## 2022-06-02 VITALS — BP 173/89 | HR 84 | Temp 97.3°F | Resp 18

## 2022-06-02 DIAGNOSIS — N189 Chronic kidney disease, unspecified: Secondary | ICD-10-CM

## 2022-06-02 LAB — FERRITIN: Ferritin: 17 ng/mL (ref 11–307)

## 2022-06-02 LAB — IRON AND TIBC
Iron: 22 ug/dL — ABNORMAL LOW (ref 28–170)
Saturation Ratios: 7 % — ABNORMAL LOW (ref 10.4–31.8)
TIBC: 337 ug/dL (ref 250–450)
UIBC: 315 ug/dL

## 2022-06-02 MED ORDER — EPOETIN ALFA-EPBX 10000 UNIT/ML IJ SOLN
INTRAMUSCULAR | Status: AC
  Filled 2022-06-02: qty 1

## 2022-06-02 MED ORDER — EPOETIN ALFA-EPBX 10000 UNIT/ML IJ SOLN
10000.0000 [IU] | INTRAMUSCULAR | Status: DC
  Administered 2022-06-02: 10000 [IU] via SUBCUTANEOUS

## 2022-06-03 LAB — POCT HEMOGLOBIN-HEMACUE: Hemoglobin: 9.3 g/dL — ABNORMAL LOW (ref 12.0–15.0)

## 2022-06-14 ENCOUNTER — Other Ambulatory Visit: Payer: Self-pay

## 2022-06-14 ENCOUNTER — Encounter (HOSPITAL_COMMUNITY): Payer: Self-pay | Admitting: Surgery

## 2022-06-14 NOTE — Progress Notes (Signed)
SDW call  Patient was given pre-op instructions over the phone. Patient verbalized understanding of instructions provided.    PCP - Dr. Vallery Sa Cardiologist - Denies Pulmonary: Denies   PPM/ICD - Denies N/a  Chest x-ray -  EKG - 04/01/2022  Stress Test - ECHO - 10/18/2017 Cardiac Cath -   Sleep Study/sleep apnea/CPAP: Denies  Type II Diabetic.  States she does not check her blood sugars, usually only gets it checked at the doctors office. Fasting Blood sugar range: Unknown How often check sugars: Doesn't check Trulicity.  Last dose 06/06/2022 Insulin NPH Regular (70/30); Morning before surgery take 40 units (100% regular dose). Evening before surgery and morning of surgery take 20 units (50% regular dose).    Blood Thinner Instructions: Denies Aspirin Instructions:Denies   ERAS Protcol - No, NPO PRE-SURGERY Ensure or G2-    COVID TEST- n/a    Anesthesia review: Yes.  DM, HTN, CHF, heart murmr. CKD   Patient denies shortness of breath, fever, cough and chest pain over the phone call  Your procedure is scheduled on Wednesday Jun 15, 2022   Report to [facility] Main Entrance "A" at 0730 A.M., then check in with the Admitting office.  Call this number if you have problems the morning of surgery:  [phone]   If you have any questions prior to your surgery date call [phone]: Open Monday-Friday 8am-4pm If you experience any cold or flu symptoms such as cough, fever, chills, shortness of breath, etc. between now and your scheduled surgery, please notify us at the above number    Remember:  Do not eat or drink after midnight the night before your surgery Take these medicines the morning of surgery with A SIP OF WATER:  Amlodipine, hydrolozine, isosorbide, labetalol  As of today, STOP taking any Aspirin (unless otherwise instructed by your surgeon) Aleve, Naproxen, Ibuprofen, Motrin, Advil, Goody's, BC's, all herbal medications, fish oil, and all vitamins.

## 2022-06-14 NOTE — Progress Notes (Signed)
Patient was called to informed that the surgery time for tomorrow was changed to 08:30 o'clock. Patient was not available and this writer left a message on [phone]. Patient was instructed to be at the hospital at 06:30 o'clock.

## 2022-06-14 NOTE — Telephone Encounter (Signed)
Spoke with patient to update on new arrival time of 0630 am on 5/1. She voiced understanding.

## 2022-06-15 ENCOUNTER — Encounter (HOSPITAL_COMMUNITY): Payer: Self-pay | Admitting: Surgery

## 2022-06-15 ENCOUNTER — Encounter (HOSPITAL_COMMUNITY)
Admission: RE | Disposition: A | Discharge status: Home / Self Care | Payer: Self-pay | Source: Home / Self Care | Attending: Surgery

## 2022-06-15 ENCOUNTER — Other Ambulatory Visit: Payer: Self-pay

## 2022-06-15 ENCOUNTER — Ambulatory Visit (HOSPITAL_COMMUNITY): Payer: 59 | Admitting: Physician Assistant

## 2022-06-15 ENCOUNTER — Ambulatory Visit (HOSPITAL_COMMUNITY)
Admission: RE | Admit: 2022-06-15 | Discharge: 2022-06-15 | Disposition: A | Discharge status: Home / Self Care | Payer: 59 | Attending: Surgery | Admitting: Surgery

## 2022-06-15 ENCOUNTER — Ambulatory Visit (HOSPITAL_BASED_OUTPATIENT_CLINIC_OR_DEPARTMENT_OTHER): Payer: 59 | Admitting: Physician Assistant

## 2022-06-15 DIAGNOSIS — E1122 Type 2 diabetes mellitus with diabetic chronic kidney disease: Secondary | ICD-10-CM | POA: Diagnosis not present

## 2022-06-15 DIAGNOSIS — N185 Chronic kidney disease, stage 5: Secondary | ICD-10-CM

## 2022-06-15 DIAGNOSIS — T82898A Other specified complication of vascular prosthetic devices, implants and grafts, initial encounter: Secondary | ICD-10-CM

## 2022-06-15 DIAGNOSIS — I509 Heart failure, unspecified: Secondary | ICD-10-CM | POA: Insufficient documentation

## 2022-06-15 DIAGNOSIS — I132 Hypertensive heart and chronic kidney disease with heart failure and with stage 5 chronic kidney disease, or end stage renal disease: Secondary | ICD-10-CM | POA: Insufficient documentation

## 2022-06-15 DIAGNOSIS — Z6841 Body Mass Index (BMI) 40.0 and over, adult: Secondary | ICD-10-CM | POA: Diagnosis not present

## 2022-06-15 DIAGNOSIS — D631 Anemia in chronic kidney disease: Secondary | ICD-10-CM

## 2022-06-15 HISTORY — PX: FISTULA SUPERFICIALIZATION: SHX6341

## 2022-06-15 LAB — POCT I-STAT, CHEM 8
BUN: 52 mg/dL — ABNORMAL HIGH (ref 6–20)
Calcium, Ion: 1.23 mmol/L (ref 1.15–1.40)
Chloride: 108 mmol/L (ref 98–111)
Creatinine, Ser: 7.3 mg/dL — ABNORMAL HIGH (ref 0.44–1.00)
Glucose, Bld: 98 mg/dL (ref 70–99)
HCT: 30 % — ABNORMAL LOW (ref 36.0–46.0)
Hemoglobin: 10.2 g/dL — ABNORMAL LOW (ref 12.0–15.0)
Potassium: 3.6 mmol/L (ref 3.5–5.1)
Sodium: 139 mmol/L (ref 135–145)
TCO2: 22 mmol/L (ref 22–32)

## 2022-06-15 LAB — POCT PREGNANCY, URINE: Preg Test, Ur: NEGATIVE

## 2022-06-15 LAB — GLUCOSE, CAPILLARY
Glucose-Capillary: 92 mg/dL (ref 70–99)
Glucose-Capillary: 148 mg/dL — ABNORMAL HIGH (ref 70–99)

## 2022-06-15 SURGERY — FISTULA SUPERFICIALIZATION
Anesthesia: General | Site: Arm Upper | Laterality: Left

## 2022-06-15 MED ORDER — PROPOFOL 10 MG/ML IV BOLUS
INTRAVENOUS | Status: DC | PRN
  Administered 2022-06-15: 180 mg via INTRAVENOUS
  Administered 2022-06-15: 50 mg via INTRAVENOUS

## 2022-06-15 MED ORDER — GLYCOPYRROLATE 0.2 MG/ML IJ SOLN
INTRAMUSCULAR | Status: DC | PRN
  Administered 2022-06-15: .2 mg via INTRAVENOUS

## 2022-06-15 MED ORDER — VASOPRESSIN 20 UNIT/ML IV SOLN
INTRAVENOUS | Status: DC | PRN
  Administered 2022-06-15: 1 [IU] via INTRAVENOUS
  Administered 2022-06-15: 2 [IU] via INTRAVENOUS
  Administered 2022-06-15: 1 [IU] via INTRAVENOUS
  Administered 2022-06-15: 2 [IU] via INTRAVENOUS
  Administered 2022-06-15 (×3): 1 [IU] via INTRAVENOUS

## 2022-06-15 MED ORDER — PROPOFOL 10 MG/ML IV BOLUS
INTRAVENOUS | Status: AC
  Filled 2022-06-15: qty 20

## 2022-06-15 MED ORDER — ACETAMINOPHEN 160 MG/5ML PO SOLN: 1000.0000 mg | Freq: Once | ORAL | Status: DC | PRN

## 2022-06-15 MED ORDER — ACETAMINOPHEN 500 MG PO TABS: 1000.0000 mg | ORAL_TABLET | Freq: Once | ORAL | Status: DC | PRN

## 2022-06-15 MED ORDER — SODIUM CHLORIDE 0.9 % IV SOLN: INTRAVENOUS | Status: DC

## 2022-06-15 MED ORDER — ACETAMINOPHEN 10 MG/ML IV SOLN: 1000.0000 mg | Freq: Once | INTRAVENOUS | Status: DC | PRN

## 2022-06-15 MED ORDER — BUPIVACAINE LIPOSOME 1.3 % IJ SUSP
INTRAMUSCULAR | Status: DC | PRN
  Administered 2022-06-15: 20 mL

## 2022-06-15 MED ORDER — HEPARIN 6000 UNIT IRRIGATION SOLUTION
Status: DC | PRN
  Administered 2022-06-15: 1

## 2022-06-15 MED ORDER — ONDANSETRON HCL 4 MG/2ML IJ SOLN
INTRAMUSCULAR | Status: DC | PRN
  Administered 2022-06-15: 4 mg via INTRAVENOUS

## 2022-06-15 MED ORDER — CEFAZOLIN IN SODIUM CHLORIDE 3-0.9 GM/100ML-% IV SOLN
3.0000 g | INTRAVENOUS | Status: AC
  Administered 2022-06-15: 3 g via INTRAVENOUS
  Filled 2022-06-15: qty 100

## 2022-06-15 MED ORDER — BUPIVACAINE LIPOSOME 1.3 % IJ SUSP
INTRAMUSCULAR | Status: AC
  Filled 2022-06-15: qty 20

## 2022-06-15 MED ORDER — HEPARIN 6000 UNIT IRRIGATION SOLUTION
Status: AC
  Filled 2022-06-15: qty 500

## 2022-06-15 MED ORDER — CHLORHEXIDINE GLUCONATE 0.12 % MT SOLN
15.0000 mL | Freq: Once | OROMUCOSAL | Status: AC
  Administered 2022-06-15: 15 mL via OROMUCOSAL
  Filled 2022-06-15: qty 15

## 2022-06-15 MED ORDER — EPHEDRINE SULFATE-NACL 50-0.9 MG/10ML-% IV SOSY
PREFILLED_SYRINGE | INTRAVENOUS | Status: DC | PRN
  Administered 2022-06-15: 5 mg via INTRAVENOUS
  Administered 2022-06-15: 15 mg via INTRAVENOUS
  Administered 2022-06-15: 5 mg via INTRAVENOUS

## 2022-06-15 MED ORDER — FENTANYL CITRATE (PF) 250 MCG/5ML IJ SOLN
INTRAMUSCULAR | Status: AC
  Filled 2022-06-15: qty 5

## 2022-06-15 MED ORDER — MIDAZOLAM HCL 2 MG/2ML IJ SOLN
INTRAMUSCULAR | Status: AC
  Filled 2022-06-15: qty 2

## 2022-06-15 MED ORDER — FENTANYL CITRATE (PF) 100 MCG/2ML IJ SOLN: 25.0000 ug | INTRAMUSCULAR | Status: DC | PRN

## 2022-06-15 MED ORDER — CHLORHEXIDINE GLUCONATE 4 % EX SOLN: 60.0000 mL | Freq: Once | CUTANEOUS | Status: DC

## 2022-06-15 MED ORDER — 0.9 % SODIUM CHLORIDE (POUR BTL) OPTIME
TOPICAL | Status: DC | PRN
  Administered 2022-06-15: 1000 mL

## 2022-06-15 MED ORDER — PHENYLEPHRINE 80 MCG/ML (10ML) SYRINGE FOR IV PUSH (FOR BLOOD PRESSURE SUPPORT)
PREFILLED_SYRINGE | INTRAVENOUS | Status: DC | PRN
  Administered 2022-06-15: 160 ug via INTRAVENOUS
  Administered 2022-06-15 (×2): 200 ug via INTRAVENOUS

## 2022-06-15 MED ORDER — LACTATED RINGERS IV SOLN: INTRAVENOUS | Status: DC

## 2022-06-15 MED ORDER — MIDAZOLAM HCL 5 MG/5ML IJ SOLN
INTRAMUSCULAR | Status: DC | PRN
  Administered 2022-06-15: 2 mg via INTRAVENOUS

## 2022-06-15 MED ORDER — OXYCODONE HCL 5 MG/5ML PO SOLN: 5.0000 mg | Freq: Once | ORAL | Status: DC | PRN

## 2022-06-15 MED ORDER — VASOPRESSIN 20 UNIT/ML IV SOLN
INTRAVENOUS | Status: AC
  Filled 2022-06-15: qty 1

## 2022-06-15 MED ORDER — LIDOCAINE 2% (20 MG/ML) 5 ML SYRINGE
INTRAMUSCULAR | Status: DC | PRN
  Administered 2022-06-15: 60 mg via INTRAVENOUS

## 2022-06-15 MED ORDER — ORAL CARE MOUTH RINSE: 15.0000 mL | Freq: Once | OROMUCOSAL | Status: AC

## 2022-06-15 MED ORDER — PHENYLEPHRINE HCL-NACL 20-0.9 MG/250ML-% IV SOLN
INTRAVENOUS | Status: DC | PRN
  Administered 2022-06-15: 75 ug/min via INTRAVENOUS

## 2022-06-15 MED ORDER — OXYCODONE HCL 5 MG PO TABS: 5.0000 mg | ORAL_TABLET | Freq: Once | ORAL | Status: DC | PRN

## 2022-06-15 MED ORDER — INSULIN ASPART 100 UNIT/ML IJ SOLN: 0.0000 [IU] | INTRAMUSCULAR | Status: DC | PRN

## 2022-06-15 MED ORDER — HYDROMORPHONE HCL 2 MG PO TABS: 2.0000 mg | ORAL_TABLET | Freq: Two times a day (BID) | ORAL | 0 refills | Status: AC | PRN

## 2022-06-15 MED ORDER — FENTANYL CITRATE (PF) 100 MCG/2ML IJ SOLN
INTRAMUSCULAR | Status: DC | PRN
  Administered 2022-06-15 (×2): 50 ug via INTRAVENOUS
  Administered 2022-06-15: 25 ug via INTRAVENOUS
  Administered 2022-06-15: 50 ug via INTRAVENOUS

## 2022-06-15 SURGICAL SUPPLY — 36 items
ADH SKN CLS APL DERMABOND .7 (GAUZE/BANDAGES/DRESSINGS) ×2
ARMBAND PINK RESTRICT EXTREMIT (MISCELLANEOUS) ×1 IMPLANT
BAG COUNTER SPONGE SURGICOUNT (BAG) ×1 IMPLANT
BAG DECANTER FOR FLEXI CONT (MISCELLANEOUS) IMPLANT
BAG SPNG CNTER NS LX DISP (BAG) ×1
CANISTER SUCT 3000ML PPV (MISCELLANEOUS) ×1 IMPLANT
CLIP TI MEDIUM 6 (CLIP) ×1 IMPLANT
CLIP TI WIDE RED SMALL 6 (CLIP) ×1 IMPLANT
COVER PROBE W GEL 5X96 (DRAPES) ×1 IMPLANT
COVER SURGICAL LIGHT HANDLE (MISCELLANEOUS) IMPLANT
DERMABOND ADVANCED .7 DNX12 (GAUZE/BANDAGES/DRESSINGS) ×1 IMPLANT
ELECT REM PT RETURN 9FT ADLT (ELECTROSURGICAL) ×1
ELECTRODE REM PT RTRN 9FT ADLT (ELECTROSURGICAL) ×1 IMPLANT
GLOVE SURG SS PI 7.5 STRL IVOR (GLOVE) ×3 IMPLANT
GOWN STRL REUS W/ TWL LRG LVL3 (GOWN DISPOSABLE) ×2 IMPLANT
GOWN STRL REUS W/ TWL XL LVL3 (GOWN DISPOSABLE) ×1 IMPLANT
GOWN STRL REUS W/TWL LRG LVL3 (GOWN DISPOSABLE) ×2
GOWN STRL REUS W/TWL XL LVL3 (GOWN DISPOSABLE) ×1
HEMOSTAT SNOW SURGICEL 2X4 (HEMOSTASIS) IMPLANT
KIT BASIN OR (CUSTOM PROCEDURE TRAY) ×1 IMPLANT
KIT TURNOVER KIT B (KITS) ×1 IMPLANT
LOOP VASCULAR MINI 18 RED (MISCELLANEOUS) ×1
NS IRRIG 1000ML POUR BTL (IV SOLUTION) ×1 IMPLANT
PACK CV ACCESS (CUSTOM PROCEDURE TRAY) ×1 IMPLANT
PAD ARMBOARD 7.5X6 YLW CONV (MISCELLANEOUS) ×2 IMPLANT
SLING ARM FOAM STRAP LRG (SOFTGOODS) IMPLANT
SLING ARM FOAM STRAP MED (SOFTGOODS) IMPLANT
SUT PROLENE 6 0 BV (SUTURE) IMPLANT
SUT PROLENE 6 0 CC (SUTURE) ×1 IMPLANT
SUT VIC AB 3-0 SH 27 (SUTURE) ×3
SUT VIC AB 3-0 SH 27X BRD (SUTURE) ×1 IMPLANT
SUT VICRYL 4-0 PS2 18IN ABS (SUTURE) IMPLANT
TOWEL GREEN STERILE (TOWEL DISPOSABLE) ×1 IMPLANT
UNDERPAD 30X36 HEAVY ABSORB (UNDERPADS AND DIAPERS) ×1 IMPLANT
VASCULAR TIE MINI RED 18IN STL (MISCELLANEOUS) IMPLANT
WATER STERILE IRR 1000ML POUR (IV SOLUTION) ×1 IMPLANT

## 2022-06-15 NOTE — Transfer of Care (Signed)
Immediate Anesthesia Transfer of Care Note  Patient: Lindsay Tucker  Procedure(s) Performed: LEFT ARM BRACHIOCEPHALIC FISTULA SUPERFICIALIZATION WITH BRANCH LIGATION (Left: Arm Upper)  Patient Location: PACU  Anesthesia Type:General  Level of Consciousness: awake  Airway & Oxygen Therapy: Patient Spontanous Breathing and Patient connected to nasal cannula oxygen  Post-op Assessment: Report given to RN and Post -op Vital signs reviewed and stable  Post vital signs: Reviewed and stable  Last Vitals:  Vitals Value Taken Time  BP 160/87 06/15/22 1013  Temp    Pulse 76 06/15/22 1017  Resp 25 06/15/22 1017  SpO2 92 % 06/15/22 1017  Vitals shown include unvalidated device data.  Last Pain:  Vitals:   06/15/22 0711  PainSc: 0-No pain         Complications: No notable events documented.

## 2022-06-15 NOTE — Anesthesia Procedure Notes (Signed)
Procedure Name: LMA Insertion Date/Time: 06/15/2022 8:50 AM  Performed by: Caren Macadam, CRNAPre-anesthesia Checklist: Patient identified, Emergency Drugs available, Suction available and Patient being monitored Patient Re-evaluated:Patient Re-evaluated prior to induction Oxygen Delivery Method: Circle system utilized Preoxygenation: Pre-oxygenation with 100% oxygen Induction Type: IV induction Ventilation: Mask ventilation without difficulty LMA: LMA inserted LMA Size: 4.0 Number of attempts: 1 Placement Confirmation: positive ETCO2 and breath sounds checked- equal and bilateral Tube secured with: Tape Dental Injury: Teeth and Oropharynx as per pre-operative assessment

## 2022-06-15 NOTE — Interval H&P Note (Signed)
History and Physical Interval Note:  06/15/2022 8:33 AM  Lindsay Tucker  has presented today for surgery, with the diagnosis of CKD V.  The various methods of treatment have been discussed with the patient and family. After consideration of risks, benefits and other options for treatment, the patient has consented to  Procedure(s): LEFT ARM BRACHIOCEPHALIC FISTULA SUPERFICIALIZATION WITH BRANCH LIGATION (Left) as a surgical intervention.  The patient's history has been reviewed, patient examined, no change in status, stable for surgery.  I have reviewed the patient's chart and labs.  Questions were answered to the patient's satisfaction.     Durene Cal

## 2022-06-15 NOTE — Anesthesia Preprocedure Evaluation (Signed)
Anesthesia Evaluation  Patient identified by MRN, date of birth, ID band Patient awake    Reviewed: Allergy & Precautions, NPO status , Patient's Chart, lab work & pertinent test results  History of Anesthesia Complications Negative for: history of anesthetic complications  Airway Mallampati: II  TM Distance: >3 FB Neck ROM: Full    Dental  (+) Dental Advisory Given,    Pulmonary neg pulmonary ROS   breath sounds clear to auscultation       Cardiovascular hypertension, Pt. on medications +CHF   Rhythm:Regular     Neuro/Psych negative neurological ROS  negative psych ROS   GI/Hepatic negative GI ROS, Neg liver ROS,,,  Endo/Other  diabetes, Type 2  Morbid obesity  Renal/GU CRFRenal disease     Musculoskeletal negative musculoskeletal ROS (+)    Abdominal   Peds  Hematology  (+) Blood dyscrasia, anemia Lab Results      Component                Value               Date                      WBC                      7.3                 04/13/2022                HGB                      10.2 (L)            06/15/2022                HCT                      30.0 (L)            06/15/2022                MCV                      83.0                04/13/2022                PLT                      339                 04/13/2022              Anesthesia Other Findings   Reproductive/Obstetrics                             Anesthesia Physical Anesthesia Plan  ASA: 3  Anesthesia Plan: General   Post-op Pain Management: Ofirmev IV (intra-op)*   Induction: Intravenous  PONV Risk Score and Plan: 3 and Ondansetron and Dexamethasone  Airway Management Planned: LMA  Additional Equipment: None  Intra-op Plan:   Post-operative Plan: Extubation in OR  Informed Consent: I have reviewed the patients History and Physical, chart, labs and discussed the procedure including the risks, benefits and  alternatives for the proposed anesthesia with the patient or authorized representative who has indicated his/her understanding  and acceptance.     Dental advisory given  Plan Discussed with: CRNA  Anesthesia Plan Comments:        Anesthesia Quick Evaluation

## 2022-06-15 NOTE — Op Note (Signed)
    Patient name: Lindsay Tucker MRN: [id number] DOB: [birth year] Sex: female  06/15/2022 Pre-operative Diagnosis: CKD Post-operative diagnosis:  Same Surgeon:  Durene Cal Assistants:  Aggie Moats Procedure:   Revision of left brachiocephalic fistula (elevation and branch ligation x 4 Anesthesia:  general Blood Loss:  minimal Specimens:  none  Findings: Adequate appearing vein greater than 5 mm throughout.  Several branches were ligated and then the fistula was elevated  Indications: This is a 37 year old female with CKD 4/5 who has a left brachiocephalic fistula that is maturing but is too deep.  She comes in today for elevation.  Procedure:  The patient was identified in the holding area and taken to [facility] OR ROOM 12  The patient was then placed supine on the table. general anesthesia was administered.  The patient was prepped and draped in the usual sterile fashion.  A time out was called and antibiotics were administered.  A PA was necessary to explain the procedure and assist with technical details.  He helped with exposure by providing suction and retraction.  He helped with wound closure.  Ultrasound was used to mark the course of the cephalic vein in the left upper arm.  Side branches were also noted.  2 longitudinal incisions were made over top of the fistula.  The fistula was then circumferentially mobilized from the antecubital crease up to the axilla.  There were 4 large branches that were divided between silk ties.  The vein measured 5-6 mm throughout.  There was an excellent thrill within the fistula.  The wound was irrigated.  Hemostasis was achieved.  In order to elevate the fistula, reapproximated subcutaneous tissue posterior to the fistula with interrupted 3-0 Vicryl.  Next, the skin was closed directly anterior to the fistula with 4-0 Vicryl.  Dermabond was applied.  There were no immediate complications.   Disposition: To PACU stable.   Juleen China, M.D.,  [facility] [place] Office: [phone] Pager:  [phone]

## 2022-06-15 NOTE — Discharge Instructions (Signed)
° °  [facility] [place] ° °Discharge Instructions ° °AV Fistula or Graft Surgery for Dialysis Access ° °Please refer to the following instructions for your post-procedure care. Your surgeon or physician assistant will discuss any changes with you. ° °Activity ° °You may drive the day following your surgery, if you are comfortable and no longer taking prescription pain medication. Resume full activity as the soreness in your incision resolves. ° °Bathing/Showering ° °You may shower after you go home. Keep your incision dry for 48 hours. Do not soak in a bathtub, hot tub, or swim until the incision heals completely. You may not shower if you have a hemodialysis catheter. ° °Incision Care ° °Clean your incision with mild soap and water after 48 hours. Pat the area dry with a clean towel. You do not need a bandage unless otherwise instructed. Do not apply any ointments or creams to your incision. You may have skin glue on your incision. Do not peel it off. It will come off on its own in about one week. Your arm may swell a bit after surgery. To reduce swelling use pillows to elevate your arm so it is above your heart. Your doctor will tell you if you need to lightly wrap your arm with an ACE bandage. ° °Diet ° °Resume your normal diet. There are not special food restrictions following this procedure. In order to heal from your surgery, it is CRITICAL to get adequate nutrition. Your body requires vitamins, minerals, and protein. Vegetables are the best source of vitamins and minerals. Vegetables also provide the perfect balance of protein. Processed food has little nutritional value, so try to avoid this. ° °Medications ° °Resume taking all of your medications. If your incision is causing pain, you may take over-the counter pain relievers such as acetaminophen (Tylenol). If you were prescribed a stronger pain medication, please be aware these medications can cause nausea and constipation. Prevent  nausea by taking the medication with a snack or meal. Avoid constipation by drinking plenty of fluids and eating foods with high amount of fiber, such as fruits, vegetables, and grains. Do not take Tylenol if you are taking prescription pain medications. ° ° ° ° °Follow up °Your surgeon may want to see you in the office following your access surgery. If so, this will be arranged at the time of your surgery. ° °Please call us immediately for any of the following conditions: ° °Increased pain, redness, drainage (pus) from your incision site °Fever of 101 degrees or higher °Severe or worsening pain at your incision site °Hand pain or numbness. ° °Reduce your risk of vascular disease: ° °Stop smoking. If you would like help, call [facility] at 1-800-QUIT-NOW (1-800-784-8669) or [place] at 336-586-4000 ° °Manage your cholesterol °Maintain a desired weight °Control your diabetes °Keep your blood pressure down ° °Dialysis ° °It will take several weeks to several months for your new dialysis access to be ready for use. Your surgeon will determine when it is OK to use it. Your nephrologist will continue to direct your dialysis. You can continue to use your Permcath until your new access is ready for use. ° °If you have any questions, please call the office at [phone]. ° °

## 2022-06-16 ENCOUNTER — Ambulatory Visit (HOSPITAL_COMMUNITY)
Admission: RE | Admit: 2022-06-16 | Discharge: 2022-06-16 | Disposition: A | Discharge status: Home / Self Care | Payer: 59 | Source: Ambulatory Visit | Attending: Nephrology | Admitting: Nephrology

## 2022-06-16 ENCOUNTER — Encounter (HOSPITAL_COMMUNITY): Payer: Self-pay | Admitting: Surgery

## 2022-06-16 VITALS — BP 162/86 | HR 75 | Temp 97.0°F | Resp 18

## 2022-06-16 DIAGNOSIS — N189 Chronic kidney disease, unspecified: Secondary | ICD-10-CM | POA: Diagnosis not present

## 2022-06-16 MED ORDER — EPOETIN ALFA-EPBX 10000 UNIT/ML IJ SOLN
10000.0000 [IU] | INTRAMUSCULAR | Status: DC
  Administered 2022-06-16: 10000 [IU] via SUBCUTANEOUS

## 2022-06-16 MED ORDER — EPOETIN ALFA-EPBX 10000 UNIT/ML IJ SOLN
INTRAMUSCULAR | Status: AC
  Filled 2022-06-16: qty 1

## 2022-06-16 NOTE — Anesthesia Postprocedure Evaluation (Signed)
Anesthesia Post Note  Patient: Lindsay Tucker  Procedure(s) Performed: LEFT ARM BRACHIOCEPHALIC FISTULA SUPERFICIALIZATION WITH BRANCH LIGATION (Left: Arm Upper)     Patient location during evaluation: PACU Anesthesia Type: General Level of consciousness: awake and alert Pain management: pain level controlled Vital Signs Assessment: post-procedure vital signs reviewed and stable Respiratory status: spontaneous breathing, nonlabored ventilation and respiratory function stable Cardiovascular status: blood pressure returned to baseline and stable Postop Assessment: no apparent nausea or vomiting Anesthetic complications: no   No notable events documented.  Last Vitals:  Vitals:   06/15/22 1030 06/15/22 1045  BP: (!) 150/86 (!) 158/82  Pulse: 76 81  Resp: 19 20  Temp:  36.4 C  SpO2: 93% 97%    Last Pain:  Vitals:   06/15/22 1045  PainSc: 0-No pain                 Maecy Podgurski

## 2022-06-20 ENCOUNTER — Telehealth: Payer: Self-pay

## 2022-06-20 NOTE — Telephone Encounter (Signed)
Caller: Patient  Concern: slight oozing from incision after showering  Location: left arm  Treatments:  Wash gently with soap and water, pat dry, put pressure on incision if there is any oozing noted  Procedure: Dialysis Access Surgery  Resolution: Instructed to call back if symptoms persist

## 2022-06-27 ENCOUNTER — Ambulatory Visit: Payer: 59 | Admitting: Bariatrics

## 2022-06-29 ENCOUNTER — Other Ambulatory Visit (HOSPITAL_COMMUNITY): Payer: Self-pay | Admitting: *Deleted

## 2022-06-30 ENCOUNTER — Encounter (HOSPITAL_COMMUNITY)
Admission: RE | Admit: 2022-06-30 | Discharge: 2022-06-30 | Disposition: A | Discharge status: Home / Self Care | Payer: 59 | Source: Ambulatory Visit | Attending: Nephrology | Admitting: Nephrology

## 2022-06-30 VITALS — BP 180/88 | HR 84 | Temp 97.3°F | Resp 17

## 2022-06-30 DIAGNOSIS — N189 Chronic kidney disease, unspecified: Secondary | ICD-10-CM | POA: Diagnosis not present

## 2022-06-30 LAB — FERRITIN: Ferritin: 33 ng/mL (ref 11–307)

## 2022-06-30 LAB — IRON AND TIBC
Iron: 25 ug/dL — ABNORMAL LOW (ref 28–170)
Saturation Ratios: 8 % — ABNORMAL LOW (ref 10.4–31.8)
TIBC: 322 ug/dL (ref 250–450)
UIBC: 297 ug/dL

## 2022-06-30 LAB — POCT HEMOGLOBIN-HEMACUE: Hemoglobin: 9.5 g/dL — ABNORMAL LOW (ref 12.0–15.0)

## 2022-06-30 MED ORDER — EPOETIN ALFA-EPBX 10000 UNIT/ML IJ SOLN: 20000.0000 [IU] | INTRAMUSCULAR | Status: DC

## 2022-06-30 MED ORDER — EPOETIN ALFA-EPBX 10000 UNIT/ML IJ SOLN
INTRAMUSCULAR | Status: AC
  Administered 2022-06-30: 20000 [IU] via SUBCUTANEOUS
  Filled 2022-06-30: qty 2

## 2022-07-07 ENCOUNTER — Ambulatory Visit (INDEPENDENT_AMBULATORY_CARE_PROVIDER_SITE_OTHER): Payer: 59 | Admitting: Physician Assistant

## 2022-07-07 VITALS — BP 187/85 | HR 81 | Temp 97.6°F | Resp 18 | Ht 66.0 in | Wt 268.0 lb

## 2022-07-07 DIAGNOSIS — N185 Chronic kidney disease, stage 5: Secondary | ICD-10-CM

## 2022-07-07 NOTE — Progress Notes (Signed)
    Postoperative Access Visit   History of Present Illness   Lindsay Tucker is a 37 y.o. year old female who presents for postoperative follow-up for: revision of left brachiocephalic AV fistula (elevation and branch ligation x 4) on 06/15/22 by Dr. Myra Gianotti. The patient's wounds are well healed.  The patient notes no steal symptoms.  The patient is able to complete their activities of daily living. She is not currently on hemodialysis. She is under the care of Dr. Malen Gauze for her CKD.  Physical Examination   Vitals:   07/07/22 0954  BP: (!) 187/85  Pulse: 81  Resp: 18  Temp: 97.6 F (36.4 C)  TempSrc: Temporal  SpO2: 98%  Weight: 268 lb (121.6 kg)  Height: 5\' 6"  (1.676 m)   Body mass index is 43.26 kg/m.  left arm Incision is well healed, 2+ radial pulse, hand grip is 5/5, sensation in digits is intact, palpable thrill, bruit can be auscultated     Medical Decision Making   Lindsay Tucker is a 37 y.o. year old female who presents s/p revision of left brachiocephalic AV fistula (elevation and branch ligation x 4) on 06/15/22 by Dr. Myra Gianotti. The patient's wounds are well healed.  The patient notes no steal symptoms.  She will continue follow up with Dr. Malen Gauze The patient's access will be ready for use after 07/16/22 She knows to call us for follow up should she have any concerns about her arm or if she does not feel a thrill in her fistula The patient may follow up on a prn basis   Graceann Congress, PA-C [facility] [place] Office: [phone]  Clinic MD: [place]/ Myra Gianotti

## 2022-07-12 ENCOUNTER — Ambulatory Visit: Payer: 59 | Admitting: Bariatrics

## 2022-07-14 ENCOUNTER — Encounter (HOSPITAL_COMMUNITY)
Admission: RE | Admit: 2022-07-14 | Discharge: 2022-07-14 | Disposition: A | Discharge status: Home / Self Care | Payer: 59 | Source: Ambulatory Visit | Attending: Nephrology | Admitting: Nephrology

## 2022-07-14 VITALS — BP 168/74 | HR 79 | Temp 98.1°F | Resp 17

## 2022-07-14 DIAGNOSIS — N189 Chronic kidney disease, unspecified: Secondary | ICD-10-CM

## 2022-07-14 LAB — POCT HEMOGLOBIN-HEMACUE: Hemoglobin: 9.9 g/dL — ABNORMAL LOW (ref 12.0–15.0)

## 2022-07-14 MED ORDER — SODIUM CHLORIDE 0.9 % IV SOLN
510.0000 mg | Freq: Once | INTRAVENOUS | Status: AC
  Administered 2022-07-14: 510 mg via INTRAVENOUS
  Filled 2022-07-14: qty 510

## 2022-07-14 MED ORDER — EPOETIN ALFA-EPBX 10000 UNIT/ML IJ SOLN: 20000.0000 [IU] | INTRAMUSCULAR | Status: DC

## 2022-07-14 MED ORDER — EPOETIN ALFA-EPBX 10000 UNIT/ML IJ SOLN
INTRAMUSCULAR | Status: AC
  Administered 2022-07-14: 20000 [IU] via SUBCUTANEOUS
  Filled 2022-07-14: qty 2

## 2022-07-28 ENCOUNTER — Encounter (HOSPITAL_COMMUNITY): Payer: Self-pay | Admitting: Nephrology

## 2022-07-28 ENCOUNTER — Encounter (HOSPITAL_COMMUNITY)
Admission: RE | Admit: 2022-07-28 | Discharge: 2022-07-28 | Disposition: A | Discharge status: Home / Self Care | Payer: BC Managed Care – PPO | Source: Ambulatory Visit | Attending: Nephrology | Admitting: Nephrology

## 2022-07-28 VITALS — BP 174/88 | HR 81 | Temp 97.2°F | Resp 17

## 2022-07-28 DIAGNOSIS — N189 Chronic kidney disease, unspecified: Secondary | ICD-10-CM | POA: Insufficient documentation

## 2022-07-28 LAB — IRON AND TIBC
Iron: 21 ug/dL — ABNORMAL LOW (ref 28–170)
Saturation Ratios: 8 % — ABNORMAL LOW (ref 10.4–31.8)
TIBC: 269 ug/dL (ref 250–450)
UIBC: 248 ug/dL

## 2022-07-28 LAB — FERRITIN: Ferritin: 63 ng/mL (ref 11–307)

## 2022-07-28 LAB — POCT HEMOGLOBIN-HEMACUE: Hemoglobin: 10.3 g/dL — ABNORMAL LOW (ref 12.0–15.0)

## 2022-07-28 MED ORDER — EPOETIN ALFA-EPBX 10000 UNIT/ML IJ SOLN
20000.0000 [IU] | INTRAMUSCULAR | Status: DC
  Administered 2022-07-28: 20000 [IU] via SUBCUTANEOUS

## 2022-07-28 MED ORDER — EPOETIN ALFA-EPBX 10000 UNIT/ML IJ SOLN
INTRAMUSCULAR | Status: AC
  Filled 2022-07-28: qty 2

## 2022-08-11 ENCOUNTER — Encounter (HOSPITAL_COMMUNITY): Payer: 59

## 2023-04-03 ENCOUNTER — Encounter (HOSPITAL_COMMUNITY): Payer: Self-pay | Admitting: Nephrology

## 2023-04-06 ENCOUNTER — Ambulatory Visit (HOSPITAL_COMMUNITY): Admission: RE | Admit: 2023-04-06 | Payer: BC Managed Care – PPO | Source: Home / Self Care | Admitting: Nephrology

## 2023-04-06 ENCOUNTER — Encounter (HOSPITAL_COMMUNITY): Admission: RE | Payer: Self-pay | Source: Home / Self Care

## 2023-04-06 SURGERY — A/V FISTULAGRAM
Anesthesia: LOCAL

## 2023-04-11 ENCOUNTER — Encounter (HOSPITAL_COMMUNITY)
Admission: RE | Disposition: A | Discharge status: Home / Self Care | Payer: Self-pay | Source: Home / Self Care | Attending: Nephrology

## 2023-04-11 ENCOUNTER — Other Ambulatory Visit: Payer: Self-pay

## 2023-04-11 ENCOUNTER — Ambulatory Visit (HOSPITAL_COMMUNITY)
Admission: RE | Admit: 2023-04-11 | Discharge: 2023-04-11 | Disposition: A | Discharge status: Home / Self Care | Payer: BC Managed Care – PPO | Attending: Nephrology | Admitting: Nephrology

## 2023-04-11 ENCOUNTER — Encounter (HOSPITAL_COMMUNITY): Payer: Self-pay | Admitting: Nephrology

## 2023-04-11 DIAGNOSIS — I132 Hypertensive heart and chronic kidney disease with heart failure and with stage 5 chronic kidney disease, or end stage renal disease: Secondary | ICD-10-CM | POA: Insufficient documentation

## 2023-04-11 DIAGNOSIS — Y832 Surgical operation with anastomosis, bypass or graft as the cause of abnormal reaction of the patient, or of later complication, without mention of misadventure at the time of the procedure: Secondary | ICD-10-CM | POA: Insufficient documentation

## 2023-04-11 DIAGNOSIS — N250 Renal osteodystrophy: Secondary | ICD-10-CM | POA: Diagnosis not present

## 2023-04-11 DIAGNOSIS — I509 Heart failure, unspecified: Secondary | ICD-10-CM | POA: Insufficient documentation

## 2023-04-11 DIAGNOSIS — E1122 Type 2 diabetes mellitus with diabetic chronic kidney disease: Secondary | ICD-10-CM | POA: Diagnosis not present

## 2023-04-11 DIAGNOSIS — T82858A Stenosis of vascular prosthetic devices, implants and grafts, initial encounter: Secondary | ICD-10-CM | POA: Diagnosis present

## 2023-04-11 DIAGNOSIS — T82510A Breakdown (mechanical) of surgically created arteriovenous fistula, initial encounter: Secondary | ICD-10-CM | POA: Diagnosis not present

## 2023-04-11 DIAGNOSIS — D631 Anemia in chronic kidney disease: Secondary | ICD-10-CM | POA: Insufficient documentation

## 2023-04-11 DIAGNOSIS — Z992 Dependence on renal dialysis: Secondary | ICD-10-CM | POA: Insufficient documentation

## 2023-04-11 DIAGNOSIS — N186 End stage renal disease: Secondary | ICD-10-CM | POA: Insufficient documentation

## 2023-04-11 HISTORY — PX: A/V FISTULAGRAM: CATH118298

## 2023-04-11 HISTORY — PX: PERIPHERAL VASCULAR BALLOON ANGIOPLASTY: CATH118281

## 2023-04-11 SURGERY — A/V FISTULAGRAM
Anesthesia: LOCAL

## 2023-04-11 MED ORDER — FENTANYL CITRATE (PF) 100 MCG/2ML IJ SOLN
INTRAMUSCULAR | Status: AC
  Filled 2023-04-11: qty 2

## 2023-04-11 MED ORDER — HEPARIN (PORCINE) IN NACL 1000-0.9 UT/500ML-% IV SOLN
INTRAVENOUS | Status: DC | PRN
  Administered 2023-04-11: 500 mL

## 2023-04-11 MED ORDER — MIDAZOLAM HCL 2 MG/2ML IJ SOLN
INTRAMUSCULAR | Status: AC
  Filled 2023-04-11: qty 2

## 2023-04-11 MED ORDER — ACETAMINOPHEN 325 MG PO TABS: 650.0000 mg | ORAL_TABLET | ORAL | Status: DC | PRN

## 2023-04-11 MED ORDER — SODIUM CHLORIDE 0.9 % IV SOLN: INTRAVENOUS | Status: DC

## 2023-04-11 MED ORDER — LIDOCAINE HCL (PF) 1 % IJ SOLN
INTRAMUSCULAR | Status: DC | PRN
  Administered 2023-04-11: 2 mL via INTRADERMAL

## 2023-04-11 MED ORDER — LIDOCAINE HCL (PF) 1 % IJ SOLN
INTRAMUSCULAR | Status: AC
  Filled 2023-04-11: qty 30

## 2023-04-11 MED ORDER — IODIXANOL 320 MG/ML IV SOLN
INTRAVENOUS | Status: DC | PRN
  Administered 2023-04-11: 9 mL

## 2023-04-11 MED ORDER — FENTANYL CITRATE (PF) 100 MCG/2ML IJ SOLN
INTRAMUSCULAR | Status: DC | PRN
  Administered 2023-04-11: 50 ug via INTRAVENOUS

## 2023-04-11 MED ORDER — MIDAZOLAM HCL 2 MG/2ML IJ SOLN
INTRAMUSCULAR | Status: DC | PRN
Start: 2023-04-11 — End: 2023-04-11
  Administered 2023-04-11: 1 mg via INTRAVENOUS

## 2023-04-11 MED ORDER — ONDANSETRON HCL 4 MG/2ML IJ SOLN: 4.0000 mg | Freq: Four times a day (QID) | INTRAMUSCULAR | Status: DC | PRN

## 2023-04-11 MED ORDER — SODIUM CHLORIDE 0.9% FLUSH: 3.0000 mL | INTRAVENOUS | Status: DC | PRN

## 2023-04-11 SURGICAL SUPPLY — 9 items
BAG SNAP BAND KOVER 36X36 (MISCELLANEOUS) ×2 IMPLANT
BALLN ATHLETIS 9X40X75 (BALLOONS) ×2
BALLOON ATHLETIS 9X40X75 (BALLOONS) IMPLANT
COVER DOME SNAP 22 D (MISCELLANEOUS) ×2 IMPLANT
GUIDEWIRE ANGLED .035 180CM (WIRE) IMPLANT
SHEATH PINNACLE R/O II 7F 4CM (SHEATH) IMPLANT
SYR MEDALLION 10ML (SYRINGE) IMPLANT
SYR MEDALLION 3ML (SYRINGE) IMPLANT
TRAY PV CATH (CUSTOM PROCEDURE TRAY) ×2 IMPLANT

## 2023-04-11 NOTE — Op Note (Signed)
 Patient presents with decreased access flows of her left BCF elevated with branch ligation on Jun 15, 2022.  This is her first procedure on this current access.   On examination, the brachial cephalic fistula is hyperpulsatile with an aneurysmal body and a poor thrill in the outflow. Augmentation is normal. No signs of impending rupture were seen over the aneurysm.   Summary:  1)      The patient had successful angioplasty (9x4 Athletis FE ~25 atm) of significant stenosis in the outflow cephalic vein.  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  Flow was patent and well-visualized all the way to close to the juxta level. 3)      The centrals were widely patent. 4)      This left BCF remains amenable to future percutaneous intervention.  Description of procedure: The arm was prepped and draped in the usual sterile fashion. The left upper arm brachial cephalic fistula was cannulated (16109) with an 18G Angiocath needle directed in an antegrade direction. A guidewire was inserted and exchanged for a 7 Fr sheath. Contrast [phone]) injection via the side port of the sheath was performed. 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 Patent cephalic vein arch, centrals and inflow cephalic but the anastomosis was not visualized.  The wire was advanced centrally without any difficulty. A  9x4 Athletis angioplasty balloon was inserted over a glide wire and positioned at the outflow cephalic vein stenosis. Venous angioplasty (91478) was carried out to 25 ATM with FULL effacement of the waist on the balloon.  Repeat angiogram showed 10% residual at the site of angioplasty with no evidence of extravasation.  The flow of contrast was quicker and the fistula was markedly less pulsatile.  Hemostasis: A 3-0 ethilon purse string suture was placed at the cannulation site on removal of the sheath.  Sedation: 1mg   Versed, Fentanyl. Sedation time. 8 minutes  Contrast. 9 mL  Monitoring: Because of the patient's comorbid conditions and sedation during the procedure, continuous EKG monitoring and O2 saturation monitoring was performed throughout the procedure by the RN. There were no abnormal arrhythmias encountered.  Complications: None.   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 Continue to cannulate the fistula with 15G needles.  2. Refer back for problems with flows. 3. Remove the suture next treatment.   Discharge: The patient was discharged home in stable condition. The patient was given education regarding the care of the dialysis access AVF and specific instructions in case of any problems.

## 2023-04-11 NOTE — Discharge Instructions (Signed)

## 2023-04-11 NOTE — H&P (Signed)
 Chief Complaint: Decreased flows  Interval H&P  The patient has presented today for an angiogram/ angioplasty.  Various methods of treatment have been discussed with the patient.  After consideration of risk, benefits and other options for treatment, the patient has consented to a angiogram/ angioplasty with  possible stent placement.   Risks of angiogram with potential angioplasty and stenting if needed.contrast reaction, extravasation/ bleeding, dissection, hypotension and death were explained to the patient.  The patient's history has been reviewed and the patient has been examined, no changes in status.  Stable for angiogram/angioplasty  I have reviewed the patient's chart and labs.  Questions were answered to the patient's satisfaction.  Assessment/Plan: ESRD dialyzing MWF Decreased access flows in her left brachiocephalic fistula with elevation and branch ligation x 4 on Jun 15, 2022- planning on angiogram with possibly angioplasty. Renal osteodystrophy - continue binders per home regimen. Anemia - managed with ESA's and IV iron at dialysis center. HTN - resume home regimen.   HPI: Lindsay Tucker is an 38 y.o. female history of congestive heart failure, diabetes, hypertension, ESRD dialyzing Monday Wednesdays and Fridays.  Patient being referred for decreasing access flows and a left upper arm brachiocephalic fistula which has been elevated.  ROS Per HPI.  Chemistry and CBC: Creat  Date/Time Value Ref Range Status  04/13/2022 03:40 PM 6.91 (H) 0.50 - 0.97 mg/dL Final   Creatinine, Ser  Date/Time Value Ref Range Status  06/15/2022 06:48 AM 7.30 (H) 0.44 - 1.00 mg/dL Final  40/98/1191 47:82 AM 8.20 (H) 0.44 - 1.00 mg/dL Final  95/62/1308 65:78 AM 0.74 0.44 - 1.00 mg/dL Final   No results for input(s): "NA", "K", "CL", "CO2", "GLUCOSE", "BUN", "CREATININE", "CALCIUM", "PHOS" in the last 168 hours.  Invalid input(s): "ALB" No results for input(s): "WBC",  "NEUTROABS", "HGB", "HCT", "MCV", "PLT" in the last 168 hours. Liver Function Tests: No results for input(s): "AST", "ALT", "ALKPHOS", "BILITOT", "PROT", "ALBUMIN" in the last 168 hours. No results for input(s): "LIPASE", "AMYLASE" in the last 168 hours. No results for input(s): "AMMONIA" in the last 168 hours. Cardiac Enzymes: No results for input(s): "CKTOTAL", "CKMB", "CKMBINDEX", "TROPONINI" in the last 168 hours. Iron Studies: No results for input(s): "IRON", "TIBC", "TRANSFERRIN", "FERRITIN" in the last 72 hours. PT/INR: @LABRCNTIP (inr:5)  Xrays/Other Studies: )No results found for this or any previous visit (from the past 48 hours). No results found.  PMH:   Past Medical History:  Diagnosis Date   Anemia    CHF (congestive heart failure) (HCC)    CKD (chronic kidney disease)    Diabetes mellitus without complication (HCC)    Heart murmur    History of blood transfusion    Hypertension     PSH:   Past Surgical History:  Procedure Laterality Date   AV FISTULA PLACEMENT Left 04/01/2022   Procedure: LEFT ARM BRACHIOCEPHALIC ARTERIOVENOUS (AV) FISTULA CREATION;  Surgeon: Nada Libman, MD;  Location: [facility] OR;  Service: Vascular;  Laterality: Left;   FISTULA SUPERFICIALIZATION Left 06/15/2022   Procedure: LEFT ARM BRACHIOCEPHALIC FISTULA SUPERFICIALIZATION WITH BRANCH LIGATION;  Surgeon: Nada Libman, MD;  Location: [facility] OR;  Service: Vascular;  Laterality: Left;    Allergies: No Known Allergies  Medications:   Prior to Admission medications   Medication Sig Start Date End Date Taking? Authorizing Provider  amLODipine (NORVASC) 10 MG tablet Take 10 mg by mouth in the morning.    [provider]  furosemide (LASIX) 80 MG tablet Take 80 mg by mouth daily  as needed for fluid.    [provider]  hydrALAZINE (APRESOLINE) 50 MG tablet Take 50 mg by mouth 2 (two) times daily.    [provider]  isosorbide mononitrate (IMDUR) 30 MG 24 hr tablet Take  30 mg by mouth daily.    [provider]  labetalol (NORMODYNE) 200 MG tablet Take 200 mg by mouth 2 (two) times daily.    [provider]  losartan (COZAAR) 100 MG tablet Take 100 mg by mouth in the morning.    [provider]    Discontinued Meds:  There are no discontinued medications.  Social History:  reports that she has never smoked. She has never used smokeless tobacco. She reports current alcohol use. She reports current drug use. Drug: Marijuana.  Family History:  No family history on file.  There were no vitals taken for this visit. GEN: NAD, A&Ox3, NCAT HEENT: No conjunctival pallor, EOMI NECK: Supple, no thyromegaly LUNGS: CTA B/L no rales, rhonchi or wheezing CV: RRR, No M/R/G ABD: SNDNT +BS  EXT: No lower extremity edema ACCESS: Left brachiocephalic fistula with aneurysmal arterial limb and hyper pulsatile to that outflow edge       Ellwood Steidle, Len Blalock, MD 04/11/2023, 10:41 AM

## 2023-04-11 NOTE — Progress Notes (Signed)
 POCT hcg rapid test: Lot [id number] EXP 05/25/2024 Control good Test negative
